# Patient Record
Sex: Female | Born: 1980 | Race: White | Hispanic: No | Marital: Single | Smoking: Former smoker
Health system: Southern US, Community
[De-identification: ages and names within clinical notes are randomized; demographics above are authoritative.]

## PROBLEM LIST (undated history)

## (undated) DIAGNOSIS — F909 Attention-deficit hyperactivity disorder, unspecified type: Secondary | ICD-10-CM

## (undated) DIAGNOSIS — R011 Cardiac murmur, unspecified: Secondary | ICD-10-CM

## (undated) DIAGNOSIS — F419 Anxiety disorder, unspecified: Secondary | ICD-10-CM

## (undated) DIAGNOSIS — D649 Anemia, unspecified: Secondary | ICD-10-CM

## (undated) HISTORY — DX: Anxiety disorder, unspecified: F41.9

## (undated) HISTORY — DX: Cardiac murmur, unspecified: R01.1

## (undated) HISTORY — DX: Attention-deficit hyperactivity disorder, unspecified type: F90.9

## (undated) HISTORY — DX: Anemia, unspecified: D64.9

---

## 2004-08-31 ENCOUNTER — Emergency Department: Payer: Self-pay | Admitting: Emergency Medicine

## 2004-11-16 ENCOUNTER — Emergency Department: Payer: Self-pay | Admitting: Emergency Medicine

## 2004-11-20 ENCOUNTER — Observation Stay: Payer: Self-pay | Admitting: Unknown Physician Specialty

## 2005-07-22 ENCOUNTER — Emergency Department: Payer: Self-pay | Admitting: Unknown Physician Specialty

## 2007-02-08 ENCOUNTER — Emergency Department: Payer: Self-pay | Admitting: Emergency Medicine

## 2019-05-25 HISTORY — PX: LIPOSUCTION: SHX10

## 2020-03-02 LAB — OB RESULTS CONSOLE HEPATITIS B SURFACE ANTIGEN: Hepatitis B Surface Ag: NEGATIVE

## 2020-03-02 LAB — OB RESULTS CONSOLE ANTIBODY SCREEN: Antibody Screen: NEGATIVE

## 2020-03-02 LAB — OB RESULTS CONSOLE HIV ANTIBODY (ROUTINE TESTING): HIV: NONREACTIVE

## 2020-03-02 LAB — OB RESULTS CONSOLE RUBELLA ANTIBODY, IGM: Rubella: IMMUNE

## 2020-03-02 LAB — OB RESULTS CONSOLE ABO/RH: RH Type: POSITIVE

## 2020-03-02 LAB — OB RESULTS CONSOLE RPR: RPR: NONREACTIVE

## 2020-04-08 ENCOUNTER — Other Ambulatory Visit: Payer: Self-pay | Admitting: Obstetrics and Gynecology

## 2020-04-08 DIAGNOSIS — O09522 Supervision of elderly multigravida, second trimester: Secondary | ICD-10-CM

## 2020-05-04 ENCOUNTER — Encounter: Payer: Self-pay | Admitting: *Deleted

## 2020-05-06 ENCOUNTER — Other Ambulatory Visit: Payer: Self-pay

## 2020-05-06 ENCOUNTER — Encounter: Payer: Self-pay | Admitting: *Deleted

## 2020-05-06 ENCOUNTER — Ambulatory Visit: Payer: Medicaid Other | Admitting: *Deleted

## 2020-05-06 ENCOUNTER — Ambulatory Visit: Payer: Medicaid Other | Attending: Obstetrics and Gynecology

## 2020-05-06 ENCOUNTER — Other Ambulatory Visit: Payer: Self-pay | Admitting: *Deleted

## 2020-05-06 VITALS — BP 118/69 | HR 85 | Ht 62.5 in

## 2020-05-06 DIAGNOSIS — O322XX0 Maternal care for transverse and oblique lie, not applicable or unspecified: Secondary | ICD-10-CM

## 2020-05-06 DIAGNOSIS — Z3A19 19 weeks gestation of pregnancy: Secondary | ICD-10-CM

## 2020-05-06 DIAGNOSIS — O09522 Supervision of elderly multigravida, second trimester: Secondary | ICD-10-CM | POA: Insufficient documentation

## 2020-05-06 DIAGNOSIS — D649 Anemia, unspecified: Secondary | ICD-10-CM

## 2020-05-06 DIAGNOSIS — O99012 Anemia complicating pregnancy, second trimester: Secondary | ICD-10-CM

## 2020-05-06 DIAGNOSIS — R011 Cardiac murmur, unspecified: Secondary | ICD-10-CM

## 2020-05-06 DIAGNOSIS — O99412 Diseases of the circulatory system complicating pregnancy, second trimester: Secondary | ICD-10-CM

## 2020-05-06 DIAGNOSIS — Z363 Encounter for antenatal screening for malformations: Secondary | ICD-10-CM

## 2020-06-10 ENCOUNTER — Encounter: Payer: Self-pay | Admitting: *Deleted

## 2020-06-10 ENCOUNTER — Ambulatory Visit: Payer: Medicaid Other | Admitting: *Deleted

## 2020-06-10 ENCOUNTER — Ambulatory Visit: Payer: Medicaid Other | Attending: Obstetrics and Gynecology

## 2020-06-10 ENCOUNTER — Other Ambulatory Visit: Payer: Self-pay | Admitting: *Deleted

## 2020-06-10 ENCOUNTER — Other Ambulatory Visit: Payer: Self-pay

## 2020-06-10 VITALS — BP 114/65 | HR 74

## 2020-06-10 DIAGNOSIS — D649 Anemia, unspecified: Secondary | ICD-10-CM | POA: Diagnosis not present

## 2020-06-10 DIAGNOSIS — O09522 Supervision of elderly multigravida, second trimester: Secondary | ICD-10-CM | POA: Diagnosis not present

## 2020-06-10 DIAGNOSIS — Z3A24 24 weeks gestation of pregnancy: Secondary | ICD-10-CM

## 2020-06-10 DIAGNOSIS — O99012 Anemia complicating pregnancy, second trimester: Secondary | ICD-10-CM

## 2020-06-10 DIAGNOSIS — O99891 Other specified diseases and conditions complicating pregnancy: Secondary | ICD-10-CM

## 2020-06-10 DIAGNOSIS — R011 Cardiac murmur, unspecified: Secondary | ICD-10-CM

## 2020-08-04 ENCOUNTER — Encounter: Payer: Self-pay | Admitting: *Deleted

## 2020-08-04 ENCOUNTER — Ambulatory Visit: Payer: Medicaid Other | Attending: Obstetrics and Gynecology | Admitting: *Deleted

## 2020-08-04 ENCOUNTER — Other Ambulatory Visit: Payer: Self-pay | Admitting: *Deleted

## 2020-08-04 ENCOUNTER — Ambulatory Visit (HOSPITAL_BASED_OUTPATIENT_CLINIC_OR_DEPARTMENT_OTHER): Payer: Medicaid Other

## 2020-08-04 ENCOUNTER — Other Ambulatory Visit: Payer: Self-pay

## 2020-08-04 VITALS — BP 118/61 | HR 87

## 2020-08-04 DIAGNOSIS — Z3A32 32 weeks gestation of pregnancy: Secondary | ICD-10-CM | POA: Diagnosis not present

## 2020-08-04 DIAGNOSIS — O99013 Anemia complicating pregnancy, third trimester: Secondary | ICD-10-CM | POA: Diagnosis not present

## 2020-08-04 DIAGNOSIS — O09523 Supervision of elderly multigravida, third trimester: Secondary | ICD-10-CM | POA: Insufficient documentation

## 2020-08-04 DIAGNOSIS — O09522 Supervision of elderly multigravida, second trimester: Secondary | ICD-10-CM

## 2020-08-05 ENCOUNTER — Ambulatory Visit: Payer: Medicaid Other

## 2020-09-07 LAB — OB RESULTS CONSOLE GBS: GBS: NEGATIVE

## 2020-09-08 ENCOUNTER — Telehealth (HOSPITAL_COMMUNITY): Payer: Self-pay | Admitting: *Deleted

## 2020-09-08 NOTE — Telephone Encounter (Signed)
Preadmission screen  

## 2020-09-09 ENCOUNTER — Ambulatory Visit: Payer: Medicaid Other | Attending: Obstetrics and Gynecology

## 2020-09-09 ENCOUNTER — Ambulatory Visit: Payer: Medicaid Other | Admitting: *Deleted

## 2020-09-09 ENCOUNTER — Encounter: Payer: Self-pay | Admitting: *Deleted

## 2020-09-09 ENCOUNTER — Other Ambulatory Visit: Payer: Self-pay

## 2020-09-09 VITALS — BP 124/67 | HR 84

## 2020-09-09 DIAGNOSIS — O09523 Supervision of elderly multigravida, third trimester: Secondary | ICD-10-CM | POA: Diagnosis not present

## 2020-09-09 DIAGNOSIS — O09522 Supervision of elderly multigravida, second trimester: Secondary | ICD-10-CM | POA: Diagnosis not present

## 2020-09-09 DIAGNOSIS — O09529 Supervision of elderly multigravida, unspecified trimester: Secondary | ICD-10-CM | POA: Insufficient documentation

## 2020-09-14 ENCOUNTER — Other Ambulatory Visit: Payer: Self-pay | Admitting: Obstetrics

## 2020-09-15 ENCOUNTER — Non-Acute Institutional Stay (HOSPITAL_COMMUNITY)
Admission: RE | Admit: 2020-09-15 | Discharge: 2020-09-15 | Disposition: A | Discharge status: Home / Self Care | Payer: Medicaid Other | Source: Ambulatory Visit | Attending: Internal Medicine | Admitting: Internal Medicine

## 2020-09-15 ENCOUNTER — Other Ambulatory Visit: Payer: Self-pay

## 2020-09-15 DIAGNOSIS — Z3A00 Weeks of gestation of pregnancy not specified: Secondary | ICD-10-CM | POA: Diagnosis not present

## 2020-09-15 DIAGNOSIS — D649 Anemia, unspecified: Secondary | ICD-10-CM | POA: Insufficient documentation

## 2020-09-15 DIAGNOSIS — O99019 Anemia complicating pregnancy, unspecified trimester: Secondary | ICD-10-CM | POA: Insufficient documentation

## 2020-09-15 MED ORDER — SODIUM CHLORIDE 0.9 % IV SOLN
510.0000 mg | Freq: Once | INTRAVENOUS | Status: AC
  Administered 2020-09-15: 510 mg via INTRAVENOUS
  Filled 2020-09-15: qty 510

## 2020-09-15 MED ORDER — SODIUM CHLORIDE 0.9 % IV SOLN
INTRAVENOUS | Status: DC | PRN
  Administered 2020-09-15: 250 mL via INTRAVENOUS

## 2020-09-15 NOTE — Discharge Instructions (Signed)
Ferumoxytol injection What is this medicine? FERUMOXYTOL is an iron complex. Iron is used to make healthy red blood cells, which carry oxygen and nutrients throughout the body. This medicine is used to treat iron deficiency anemia. This medicine may be used for other purposes; ask your health care provider or pharmacist if you have questions. COMMON BRAND NAME(S): Feraheme What should I tell my health care provider before I take this medicine? They need to know if you have any of these conditions:  anemia not caused by low iron levels  high levels of iron in the blood  magnetic resonance imaging (MRI) test scheduled  an unusual or allergic reaction to iron, other medicines, foods, dyes, or preservatives  pregnant or trying to get pregnant  breast-feeding How should I use this medicine? This medicine is for injection into a vein. It is given by a health care professional in a hospital or clinic setting. Talk to your pediatrician regarding the use of this medicine in children. Special care may be needed. Overdosage: If you think you have taken too much of this medicine contact a poison control center or emergency room at once. NOTE: This medicine is only for you. Do not share this medicine with others. What if I miss a dose? It is important not to miss your dose. Call your doctor or health care professional if you are unable to keep an appointment. What may interact with this medicine? This medicine may interact with the following medications:  other iron products This list may not describe all possible interactions. Give your health care provider a list of all the medicines, herbs, non-prescription drugs, or dietary supplements you use. Also tell them if you smoke, drink alcohol, or use illegal drugs. Some items may interact with your medicine. What should I watch for while using this medicine? Visit your doctor or healthcare professional regularly. Tell your doctor or healthcare  professional if your symptoms do not start to get better or if they get worse. You may need blood work done while you are taking this medicine. You may need to follow a special diet. Talk to your doctor. Foods that contain iron include: whole grains/cereals, dried fruits, beans, or peas, leafy green vegetables, and organ meats (liver, kidney). What side effects may I notice from receiving this medicine? Side effects that you should report to your doctor or health care professional as soon as possible:  allergic reactions like skin rash, itching or hives, swelling of the face, lips, or tongue  breathing problems  changes in blood pressure  feeling faint or lightheaded, falls  fever or chills  flushing, sweating, or hot feelings  swelling of the ankles or feet Side effects that usually do not require medical attention (report to your doctor or health care professional if they continue or are bothersome):  diarrhea  headache  nausea, vomiting  stomach pain This list may not describe all possible side effects. Call your doctor for medical advice about side effects. You may report side effects to FDA at 1-800-FDA-1088. Where should I keep my medicine? This drug is given in a hospital or clinic and will not be stored at home. NOTE: This sheet is a summary. It may not cover all possible information. If you have questions about this medicine, talk to your doctor, pharmacist, or health care provider.  2021 Elsevier/Gold Standard (2016-05-28 20:21:10)  

## 2020-09-15 NOTE — Progress Notes (Signed)
Patient received IV Feraheme as ordered by Philip Aspen DO. Observed for at least 30 minutes post infusion. Tolerated well, vitals stable, discharge instructions given, verbalized understanding. Patient alert, oriented and ambulatory at the time of discharge.

## 2020-09-16 ENCOUNTER — Ambulatory Visit: Payer: Medicaid Other | Attending: Obstetrics and Gynecology

## 2020-09-16 ENCOUNTER — Encounter: Payer: Self-pay | Admitting: *Deleted

## 2020-09-16 ENCOUNTER — Ambulatory Visit: Payer: Medicaid Other | Admitting: *Deleted

## 2020-09-16 VITALS — BP 125/67 | HR 82

## 2020-09-16 DIAGNOSIS — O09522 Supervision of elderly multigravida, second trimester: Secondary | ICD-10-CM | POA: Insufficient documentation

## 2020-09-16 DIAGNOSIS — O09523 Supervision of elderly multigravida, third trimester: Secondary | ICD-10-CM | POA: Insufficient documentation

## 2020-09-19 ENCOUNTER — Other Ambulatory Visit: Payer: Self-pay

## 2020-09-20 ENCOUNTER — Encounter (HOSPITAL_COMMUNITY): Payer: Self-pay | Admitting: Obstetrics

## 2020-09-20 ENCOUNTER — Inpatient Hospital Stay (HOSPITAL_COMMUNITY): Payer: Medicaid Other | Admitting: Anesthesiology

## 2020-09-20 ENCOUNTER — Inpatient Hospital Stay (HOSPITAL_COMMUNITY): Payer: Medicaid Other

## 2020-09-20 ENCOUNTER — Inpatient Hospital Stay (HOSPITAL_COMMUNITY)
Admission: AD | Admit: 2020-09-20 | Discharge: 2020-09-23 | DRG: 786 | Disposition: A | Discharge status: Home / Self Care | Payer: Medicaid Other | Attending: Obstetrics & Gynecology | Admitting: Obstetrics & Gynecology

## 2020-09-20 ENCOUNTER — Other Ambulatory Visit (HOSPITAL_COMMUNITY)
Admission: AD | Admit: 2020-09-20 | Payer: Medicaid Other | Source: Home / Self Care | Admitting: Obstetrics & Gynecology

## 2020-09-20 ENCOUNTER — Other Ambulatory Visit (HOSPITAL_COMMUNITY): Payer: Medicaid Other

## 2020-09-20 DIAGNOSIS — O9902 Anemia complicating childbirth: Principal | ICD-10-CM | POA: Diagnosis present

## 2020-09-20 DIAGNOSIS — O411230 Chorioamnionitis, third trimester, not applicable or unspecified: Secondary | ICD-10-CM | POA: Diagnosis present

## 2020-09-20 DIAGNOSIS — Z20822 Contact with and (suspected) exposure to covid-19: Secondary | ICD-10-CM | POA: Diagnosis not present

## 2020-09-20 DIAGNOSIS — Z87891 Personal history of nicotine dependence: Secondary | ICD-10-CM | POA: Diagnosis not present

## 2020-09-20 DIAGNOSIS — D509 Iron deficiency anemia, unspecified: Secondary | ICD-10-CM | POA: Diagnosis present

## 2020-09-20 DIAGNOSIS — Z3A39 39 weeks gestation of pregnancy: Secondary | ICD-10-CM | POA: Diagnosis not present

## 2020-09-20 DIAGNOSIS — O09529 Supervision of elderly multigravida, unspecified trimester: Secondary | ICD-10-CM

## 2020-09-20 DIAGNOSIS — O26893 Other specified pregnancy related conditions, third trimester: Secondary | ICD-10-CM | POA: Diagnosis present

## 2020-09-20 LAB — SARS CORONAVIRUS 2 (TAT 6-24 HRS): SARS Coronavirus 2: NEGATIVE

## 2020-09-20 LAB — CBC
HCT: 33.7 % — ABNORMAL LOW (ref 36.0–46.0)
Hemoglobin: 9.5 g/dL — ABNORMAL LOW (ref 12.0–15.0)
MCH: 20.6 pg — ABNORMAL LOW (ref 26.0–34.0)
MCHC: 28.2 g/dL — ABNORMAL LOW (ref 30.0–36.0)
MCV: 72.9 fL — ABNORMAL LOW (ref 80.0–100.0)
Platelets: 341 10*3/uL (ref 150–400)
RBC: 4.62 MIL/uL (ref 3.87–5.11)
RDW: 22.8 % — ABNORMAL HIGH (ref 11.5–15.5)
WBC: 13.3 10*3/uL — ABNORMAL HIGH (ref 4.0–10.5)
nRBC: 0.4 % — ABNORMAL HIGH (ref 0.0–0.2)

## 2020-09-20 LAB — TYPE AND SCREEN
ABO/RH(D): B POS
Antibody Screen: NEGATIVE

## 2020-09-20 LAB — RPR: RPR Ser Ql: NONREACTIVE

## 2020-09-20 MED ORDER — PHENYLEPHRINE 40 MCG/ML (10ML) SYRINGE FOR IV PUSH (FOR BLOOD PRESSURE SUPPORT)
80.0000 ug | PREFILLED_SYRINGE | INTRAVENOUS | Status: DC | PRN
  Filled 2020-09-20: qty 10

## 2020-09-20 MED ORDER — LIDOCAINE HCL (PF) 1 % IJ SOLN
INTRAMUSCULAR | Status: DC | PRN
  Administered 2020-09-20: 10 mL via EPIDURAL

## 2020-09-20 MED ORDER — OXYTOCIN-SODIUM CHLORIDE 30-0.9 UT/500ML-% IV SOLN
2.5000 [IU]/h | INTRAVENOUS | Status: DC
  Filled 2020-09-20: qty 500

## 2020-09-20 MED ORDER — TERBUTALINE SULFATE 1 MG/ML IJ SOLN
0.2500 mg | Freq: Once | INTRAMUSCULAR | Status: DC | PRN
  Filled 2020-09-20: qty 1

## 2020-09-20 MED ORDER — ACETAMINOPHEN 500 MG PO TABS: 1000.0000 mg | ORAL_TABLET | Freq: Once | ORAL | Status: DC

## 2020-09-20 MED ORDER — EPHEDRINE 5 MG/ML INJ: 10.0000 mg | INTRAVENOUS | Status: DC | PRN

## 2020-09-20 MED ORDER — OXYTOCIN-SODIUM CHLORIDE 30-0.9 UT/500ML-% IV SOLN
1.0000 m[IU]/min | INTRAVENOUS | Status: DC
  Administered 2020-09-20: 2 m[IU]/min via INTRAVENOUS
  Filled 2020-09-20: qty 500

## 2020-09-20 MED ORDER — LACTATED RINGERS IV SOLN: INTRAVENOUS | Status: DC

## 2020-09-20 MED ORDER — OXYTOCIN BOLUS FROM INFUSION: 333.0000 mL | Freq: Once | INTRAVENOUS | Status: DC

## 2020-09-20 MED ORDER — GENTAMICIN SULFATE 40 MG/ML IJ SOLN
5.0000 mg/kg | Freq: Once | INTRAVENOUS | Status: AC
  Administered 2020-09-20: 320 mg via INTRAVENOUS
  Filled 2020-09-20: qty 8

## 2020-09-20 MED ORDER — ONDANSETRON HCL 4 MG/2ML IJ SOLN: 4.0000 mg | Freq: Four times a day (QID) | INTRAMUSCULAR | Status: DC | PRN

## 2020-09-20 MED ORDER — ACETAMINOPHEN 160 MG/5ML PO SOLN
1000.0000 mg | Freq: Once | ORAL | Status: AC
  Administered 2020-09-20: 1000 mg via ORAL
  Filled 2020-09-20: qty 40.6

## 2020-09-20 MED ORDER — LACTATED RINGERS AMNIOINFUSION
INTRAVENOUS | Status: DC
Start: 2020-09-20 — End: 2020-09-21

## 2020-09-20 MED ORDER — OXYCODONE-ACETAMINOPHEN 5-325 MG PO TABS: 1.0000 | ORAL_TABLET | ORAL | Status: DC | PRN

## 2020-09-20 MED ORDER — DIPHENHYDRAMINE HCL 50 MG/ML IJ SOLN: 12.5000 mg | INTRAMUSCULAR | Status: DC | PRN

## 2020-09-20 MED ORDER — SODIUM CHLORIDE 0.9 % IV SOLN
2.0000 g | Freq: Four times a day (QID) | INTRAVENOUS | Status: DC
  Administered 2020-09-20 – 2020-09-21 (×2): 2 g via INTRAVENOUS
  Filled 2020-09-20: qty 2000

## 2020-09-20 MED ORDER — SOD CITRATE-CITRIC ACID 500-334 MG/5ML PO SOLN
30.0000 mL | ORAL | Status: DC | PRN
  Filled 2020-09-20: qty 15

## 2020-09-20 MED ORDER — ACETAMINOPHEN 325 MG PO TABS: 650.0000 mg | ORAL_TABLET | ORAL | Status: DC | PRN

## 2020-09-20 MED ORDER — MISOPROSTOL 25 MCG QUARTER TABLET
25.0000 ug | ORAL_TABLET | ORAL | Status: DC | PRN
  Administered 2020-09-20: 25 ug via VAGINAL
  Filled 2020-09-20: qty 1

## 2020-09-20 MED ORDER — OXYCODONE-ACETAMINOPHEN 5-325 MG PO TABS: 2.0000 | ORAL_TABLET | ORAL | Status: DC | PRN

## 2020-09-20 MED ORDER — FENTANYL-BUPIVACAINE-NACL 0.5-0.125-0.9 MG/250ML-% EP SOLN
12.0000 mL/h | EPIDURAL | Status: DC | PRN
  Administered 2020-09-20: 12 mL/h via EPIDURAL
  Filled 2020-09-20: qty 250

## 2020-09-20 MED ORDER — LIDOCAINE HCL (PF) 1 % IJ SOLN: 30.0000 mL | INTRAMUSCULAR | Status: DC | PRN

## 2020-09-20 MED ORDER — FENTANYL CITRATE (PF) 100 MCG/2ML IJ SOLN
50.0000 ug | INTRAMUSCULAR | Status: DC | PRN
  Administered 2020-09-20: 50 ug via INTRAVENOUS
  Filled 2020-09-20: qty 2

## 2020-09-20 MED ORDER — LACTATED RINGERS AMNIOINFUSION: INTRAVENOUS | Status: DC

## 2020-09-20 MED ORDER — TERBUTALINE SULFATE 1 MG/ML IJ SOLN
0.2500 mg | Freq: Once | INTRAMUSCULAR | Status: AC | PRN
  Administered 2020-09-21: 0.25 mg via SUBCUTANEOUS

## 2020-09-20 MED ORDER — LACTATED RINGERS IV SOLN: 500.0000 mL | Freq: Once | INTRAVENOUS | Status: DC

## 2020-09-20 MED ORDER — LACTATED RINGERS IV SOLN
500.0000 mL | INTRAVENOUS | Status: DC | PRN
  Administered 2020-09-20: 500 mL via INTRAVENOUS

## 2020-09-20 MED ORDER — PHENYLEPHRINE 40 MCG/ML (10ML) SYRINGE FOR IV PUSH (FOR BLOOD PRESSURE SUPPORT): 80.0000 ug | PREFILLED_SYRINGE | INTRAVENOUS | Status: DC | PRN

## 2020-09-20 NOTE — Anesthesia Procedure Notes (Signed)
Epidural Patient location during procedure: OB Start time: 09/20/2020 11:45 AM End time: 09/20/2020 12:00 PM  Staffing Anesthesiologist: Lucretia Kern, MD Performed: anesthesiologist   Preanesthetic Checklist Completed: patient identified, IV checked, risks and benefits discussed, monitors and equipment checked, pre-op evaluation and timeout performed  Epidural Patient position: sitting Prep: DuraPrep Patient monitoring: heart rate, continuous pulse ox and blood pressure Approach: midline Location: L3-L4 Injection technique: LOR air  Needle:  Needle type: Tuohy  Needle gauge: 18 G Needle length: 9 cm Needle insertion depth: 7 cm Catheter type: closed end flexible Catheter size: 20 Guage Catheter at skin depth: 12 cm Test dose: negative  Assessment Events: blood not aspirated, injection not painful, no injection resistance, no paresthesia and negative IV test  Additional Notes Reason for block:procedure for pain

## 2020-09-20 NOTE — Anesthesia Preprocedure Evaluation (Addendum)
Anesthesia Evaluation  Patient identified by MRN, date of birth, ID band Patient awake    Reviewed: Allergy & Precautions, H&P , NPO status , Patient's Chart, lab work & pertinent test results  History of Anesthesia Complications Negative for: history of anesthetic complications  Airway Mallampati: II  TM Distance: >3 FB Neck ROM: full    Dental no notable dental hx.    Pulmonary neg pulmonary ROS, former smoker,    Pulmonary exam normal        Cardiovascular negative cardio ROS Normal cardiovascular exam Rhythm:regular Rate:Normal     Neuro/Psych negative neurological ROS  negative psych ROS   GI/Hepatic negative GI ROS, Neg liver ROS,   Endo/Other  negative endocrine ROS  Renal/GU negative Renal ROS  negative genitourinary   Musculoskeletal   Abdominal   Peds  Hematology  (+) Blood dyscrasia, anemia ,   Anesthesia Other Findings   Reproductive/Obstetrics (+) Pregnancy Fetal intolerance to labor                            Anesthesia Physical Anesthesia Plan  ASA: II and emergent  Anesthesia Plan: Epidural   Post-op Pain Management:    Induction:   PONV Risk Score and Plan: 4 or greater and Scopolamine patch - Pre-op and Treatment may vary due to age or medical condition  Airway Management Planned: Natural Airway  Additional Equipment:   Intra-op Plan:   Post-operative Plan:   Informed Consent: I have reviewed the patients History and Physical, chart, labs and discussed the procedure including the risks, benefits and alternatives for the proposed anesthesia with the patient or authorized representative who has indicated his/her understanding and acceptance.     Dental advisory given  Plan Discussed with: Anesthesiologist and CRNA  Anesthesia Plan Comments: (Patient for urgent C/Section for fetal intolerance to labor. Will use Epidural for C/Section. M. Malen Gauze, MD)       Anesthesia Quick Evaluation

## 2020-09-20 NOTE — H&P (Signed)
Kimberly Mendez is a 40 y.o. female [phone] [redacted]w[redacted]d presenting for scheduled IOL due to [facility]. She reports on admission no LOF, VB, Contractions. Normal FM. Now contracting painfully.    Pregnancy c/b: 1. Anemia: Hgb 8.5 at 26w, received IV iron 2. Recurrent UTI 3. Advanced maternal age: Most recent EFW 2024g 99%, AC>99% on 09/09/2020  OB History    Gravida  5   Para  1   Term  1   Preterm      AB  3   Living  1     SAB  3   IAB      Ectopic      Multiple      Live Births             Past Medical History:  Diagnosis Date  . ADHD   . Anemia   . Anxiety   . Heart murmur    Past Surgical History:  Procedure Laterality Date  . LIPOSUCTION  05/25/2019   abd   Family History: family history includes Cancer in her maternal grandmother; Depression in her sister; Drug abuse in her sister; Miscarriages / Stillbirths in her sister; Varicose Veins in her paternal grandmother. Social History:  reports that she has quit smoking. She has quit using smokeless tobacco. She reports previous alcohol use. She reports previous drug use. Drug: Marijuana.    Maternal Diabetes: No Genetic Screening: Normal  [id number] Negative Maternal Ultrasounds/Referrals: Normal anatomy. Placenta Anterior Fetal Ultrasounds or other Referrals:  Referred to Materal Fetal Medicine for anatomy scan Maternal Substance Abuse:  No Significant Maternal Medications:  Meds include: Other: fosfomycin prn for UTI. Flexeril prn Significant Maternal Lab Results:  Group B Strep negative Other Comments:  AMA, anemia, UTI in pregnancy  Review of Systems Per HPI Exam Physical Exam  Dilation: Fingertip Effacement (%): Thick Station: -2 Exam by:: MScott, RN Blood pressure (!) 146/75, pulse 83, temperature 98.5 F (36.9 C), temperature source Oral, resp. rate 16, height 5' 2.5" (1.588 m), weight 83.5 kg, last menstrual period 12/21/2019. NAD, resting comfortably, painful with contractions Gravid abdomen Card:  RRR Pulm: CTAB Lower extremity no evidence of DVT Fetal testing: FHR 130, Cat 1. Toco q33m Prenatal labs: ABO, Rh:  --/--/B POS (05/31 0030) Antibody: NEG (05/31 0030) Rubella: Immune (11/10 0000) RPR: Nonreactive (11/10 0000)  HBsAg: Negative (11/10 0000)  HIV: Non-reactive (11/10 0000)  GBS: Negative/-- (05/18 0000)   Hematology Recent Labs  Lab 09/20/20 0058  WBC 13.3*  RBC 4.62  HGB 9.5*  HCT 33.7*  MCV 72.9*  MCH 20.6*  MCHC 28.2*  RDW 22.8*  PLT 341   Assessment/Plan: [name] is a 40 y.o. female [id number] [redacted]w[redacted]d admitted for scheduled IOL.  1. IOL: On admit 0.5/thick, received cytotec x1, was then unchanged but contracting q1h. Now 2/50/-3, FB placed. If contractions space, start pitocin.  LGA: shoulder dystocia precautions. No contraindications for uterotonics.  GBS neg 2. Anemia: Hgb 8.5 at 26w, received IV iron. Now hgb 9.5 3. Vaccines: s/p tdap in pregnancy. Did not receive flu or COVID  Kaylei Frink K Taam-Akelman 09/20/2020, 8:58 AM

## 2020-09-20 NOTE — Progress Notes (Signed)
OBGYN Note Kimberly Mendez comfortable with epidural Vitals:   09/20/20 1331 09/20/20 1401  BP: 130/76 126/75  Pulse: 100 88  Resp:    Temp:     FHR 130, Cat 1 Toco q37m SVE: cervix high and concern for palpating fetal part on exam. Bedside US by me confirmed VTX presentation. Defer AROM at this time.  Kimberly Mendez 40 y.o. [id number] at [redacted]w[redacted]d IOL 2/2 AMA -Continue pitocin Kimberly Mendez Kimberly Mendez 09/20/20 2:38 PM

## 2020-09-20 NOTE — Progress Notes (Signed)
Vitals:   09/20/20 2153 09/20/20 2201 09/20/20 2231 09/20/20 2253  BP:  117/71 (!) 112/52   Pulse:  (!) 116 (!) 126   Resp:      Temp: (!) 101 F (38.3 C)   (!) 100.8 F (38.2 C)  TempSrc: Axillary   Oral  Weight:      Height:       FHR 170, previously minimal variability, now moderate variability, occasional variable decels Toco irritable  Kimberly Mendez 40 y.o. [id number] at [redacted]w[redacted]d IOL for AMA 1. Presumed intraamniotic infection: new maternal fever (101) and fetal tachycardia 180s, treating as intraamniotic infection with tylenol 1g, amp 2g IV q6h and gentamycin 5mg /kg q24h 2. Fetal monitoring: previous recurrent variable decels resolved with holding pitocin and amnioinfusion, initially held amnioinfusion and restarted pitocin, due to variable decels reoccurring, restarted amnioinfusion. Patient requested to discuss CS earlier, overall fetal status reassuring, reviewed indications for CS 3. IOL: 0.5cm on admit, s/p cytotec, FB, pitocin, SROM. Continue pitocin, will continue to review plan with patient pending progression and fetal monitoring.   Kimberly Mendez 09/20/20 10:58 PM

## 2020-09-20 NOTE — Progress Notes (Signed)
OBGYN Note Vitals:   09/20/20 1801 09/20/20 1832 09/20/20 1851 09/20/20 1902  BP: 138/78 128/79  112/62  Pulse: 88 90  (!) 121  Resp:   16   Temp:   98.8 F (37.1 C)   TempSrc:   Oral   Weight:      Height:       SVE 5/80/0, IUPC placed FHR 150, Moderate variability, recurrent variable decels Toco q12m  Kimberly Mendez 40 y.o. [id number] IOL for AMA 1. IOL: s/p cytotec, FB. On pitocin. S/p SROM. IUPC placed for amnioinfusion, hold pitocin.  Continue close monitoring Kimberly Mendez 09/20/20 7:40 PM

## 2020-09-21 ENCOUNTER — Encounter (HOSPITAL_COMMUNITY)
Admission: AD | Disposition: A | Discharge status: Home / Self Care | Payer: Self-pay | Source: Home / Self Care | Attending: Obstetrics & Gynecology

## 2020-09-21 ENCOUNTER — Encounter (HOSPITAL_COMMUNITY): Payer: Self-pay | Admitting: Obstetrics

## 2020-09-21 LAB — CBC
HCT: 27 % — ABNORMAL LOW (ref 36.0–46.0)
Hemoglobin: 7.8 g/dL — ABNORMAL LOW (ref 12.0–15.0)
MCH: 21.3 pg — ABNORMAL LOW (ref 26.0–34.0)
MCHC: 28.9 g/dL — ABNORMAL LOW (ref 30.0–36.0)
MCV: 73.6 fL — ABNORMAL LOW (ref 80.0–100.0)
Platelets: 236 10*3/uL (ref 150–400)
RBC: 3.67 MIL/uL — ABNORMAL LOW (ref 3.87–5.11)
RDW: 22.9 % — ABNORMAL HIGH (ref 11.5–15.5)
WBC: 30.7 10*3/uL — ABNORMAL HIGH (ref 4.0–10.5)
nRBC: 0.3 % — ABNORMAL HIGH (ref 0.0–0.2)

## 2020-09-21 SURGERY — Surgical Case
Anesthesia: Epidural

## 2020-09-21 MED ORDER — PHENYLEPHRINE 40 MCG/ML (10ML) SYRINGE FOR IV PUSH (FOR BLOOD PRESSURE SUPPORT)
PREFILLED_SYRINGE | INTRAVENOUS | Status: AC
  Filled 2020-09-21: qty 10

## 2020-09-21 MED ORDER — NALOXONE HCL 4 MG/10ML IJ SOLN
1.0000 ug/kg/h | INTRAVENOUS | Status: DC | PRN
  Filled 2020-09-21: qty 5

## 2020-09-21 MED ORDER — BISACODYL 10 MG RE SUPP: 10.0000 mg | Freq: Every day | RECTAL | Status: DC | PRN

## 2020-09-21 MED ORDER — OXYCODONE HCL 5 MG PO TABS
5.0000 mg | ORAL_TABLET | ORAL | Status: DC | PRN
  Administered 2020-09-22: 5 mg via ORAL
  Administered 2020-09-22: 10 mg via ORAL
  Administered 2020-09-22 – 2020-09-23 (×2): 5 mg via ORAL
  Filled 2020-09-21 (×3): qty 1
  Filled 2020-09-21: qty 2
  Filled 2020-09-21: qty 1

## 2020-09-21 MED ORDER — LIDOCAINE-EPINEPHRINE (PF) 2 %-1:200000 IJ SOLN
INTRAMUSCULAR | Status: DC | PRN
  Administered 2020-09-21: 5 mL via EPIDURAL
  Administered 2020-09-21: 3 mL via EPIDURAL
  Administered 2020-09-21: 10 mL via EPIDURAL

## 2020-09-21 MED ORDER — GENTAMICIN SULFATE 40 MG/ML IJ SOLN
5.0000 mg/kg | INTRAVENOUS | Status: AC
  Administered 2020-09-21: 320 mg via INTRAVENOUS
  Filled 2020-09-21: qty 8

## 2020-09-21 MED ORDER — ONDANSETRON HCL 4 MG/2ML IJ SOLN
INTRAMUSCULAR | Status: AC
  Filled 2020-09-21: qty 2

## 2020-09-21 MED ORDER — LACTATED RINGERS IV SOLN: INTRAVENOUS | Status: DC

## 2020-09-21 MED ORDER — DIBUCAINE (PERIANAL) 1 % EX OINT: 1.0000 "application " | TOPICAL_OINTMENT | CUTANEOUS | Status: DC | PRN

## 2020-09-21 MED ORDER — DEXAMETHASONE SODIUM PHOSPHATE 4 MG/ML IJ SOLN
INTRAMUSCULAR | Status: AC
  Filled 2020-09-21: qty 1

## 2020-09-21 MED ORDER — SODIUM CHLORIDE 0.9 % IV SOLN
INTRAVENOUS | Status: AC
  Filled 2020-09-21: qty 500

## 2020-09-21 MED ORDER — ACETAMINOPHEN 500 MG PO TABS: 1000.0000 mg | ORAL_TABLET | Freq: Four times a day (QID) | ORAL | Status: DC

## 2020-09-21 MED ORDER — DEXMEDETOMIDINE (PRECEDEX) IN NS 20 MCG/5ML (4 MCG/ML) IV SYRINGE
PREFILLED_SYRINGE | INTRAVENOUS | Status: AC
  Filled 2020-09-21: qty 5

## 2020-09-21 MED ORDER — MORPHINE SULFATE (PF) 0.5 MG/ML IJ SOLN
INTRAMUSCULAR | Status: DC | PRN
  Administered 2020-09-21: 3 mg via EPIDURAL

## 2020-09-21 MED ORDER — DEXAMETHASONE SODIUM PHOSPHATE 4 MG/ML IJ SOLN
INTRAMUSCULAR | Status: DC | PRN
  Administered 2020-09-21: 4 mg via INTRAVENOUS

## 2020-09-21 MED ORDER — ACETAMINOPHEN 160 MG/5ML PO SOLN
1000.0000 mg | Freq: Four times a day (QID) | ORAL | Status: DC
  Administered 2020-09-21 – 2020-09-23 (×9): 1000 mg via ORAL
  Filled 2020-09-21 (×9): qty 40.6

## 2020-09-21 MED ORDER — OXYTOCIN-SODIUM CHLORIDE 30-0.9 UT/500ML-% IV SOLN
2.5000 [IU]/h | INTRAVENOUS | Status: AC
  Administered 2020-09-21: 2.5 [IU]/h via INTRAVENOUS

## 2020-09-21 MED ORDER — LIDOCAINE-EPINEPHRINE (PF) 2 %-1:200000 IJ SOLN
INTRAMUSCULAR | Status: DC | PRN
  Administered 2020-09-21: 5 mL via INTRADERMAL

## 2020-09-21 MED ORDER — IBUPROFEN 600 MG PO TABS: 600.0000 mg | ORAL_TABLET | Freq: Four times a day (QID) | ORAL | Status: DC

## 2020-09-21 MED ORDER — FENTANYL CITRATE (PF) 100 MCG/2ML IJ SOLN
INTRAMUSCULAR | Status: DC | PRN
  Administered 2020-09-21: 100 ug via EPIDURAL

## 2020-09-21 MED ORDER — ONDANSETRON HCL 4 MG/2ML IJ SOLN
INTRAMUSCULAR | Status: DC | PRN
  Administered 2020-09-21: 4 mg via INTRAVENOUS

## 2020-09-21 MED ORDER — NALBUPHINE HCL 10 MG/ML IJ SOLN: 5.0000 mg | INTRAMUSCULAR | Status: DC | PRN

## 2020-09-21 MED ORDER — ZOLPIDEM TARTRATE 5 MG PO TABS: 5.0000 mg | ORAL_TABLET | Freq: Every evening | ORAL | Status: DC | PRN

## 2020-09-21 MED ORDER — FENTANYL CITRATE (PF) 100 MCG/2ML IJ SOLN: 25.0000 ug | INTRAMUSCULAR | Status: DC | PRN

## 2020-09-21 MED ORDER — COCONUT OIL OIL: 1.0000 "application " | TOPICAL_OIL | Status: DC | PRN

## 2020-09-21 MED ORDER — SODIUM CHLORIDE 0.9 % IV SOLN: INTRAVENOUS | Status: DC | PRN

## 2020-09-21 MED ORDER — ONDANSETRON HCL 4 MG/2ML IJ SOLN: 4.0000 mg | Freq: Three times a day (TID) | INTRAMUSCULAR | Status: DC | PRN

## 2020-09-21 MED ORDER — HYDROMORPHONE HCL 1 MG/ML IJ SOLN: 0.2000 mg | INTRAMUSCULAR | Status: DC | PRN

## 2020-09-21 MED ORDER — FENTANYL CITRATE (PF) 100 MCG/2ML IJ SOLN: INTRAMUSCULAR | Status: DC | PRN

## 2020-09-21 MED ORDER — DEXMEDETOMIDINE (PRECEDEX) IN NS 20 MCG/5ML (4 MCG/ML) IV SYRINGE
PREFILLED_SYRINGE | INTRAVENOUS | Status: DC | PRN
  Administered 2020-09-21: 8 ug via INTRAVENOUS

## 2020-09-21 MED ORDER — COMPLETENATE 29-1 MG PO CHEW
1.0000 | CHEWABLE_TABLET | Freq: Every day | ORAL | Status: DC
  Administered 2020-09-21 – 2020-09-22 (×2): 1 via ORAL
  Filled 2020-09-21 (×2): qty 1

## 2020-09-21 MED ORDER — SODIUM CHLORIDE 0.9 % IR SOLN
Status: DC | PRN
  Administered 2020-09-21: 1

## 2020-09-21 MED ORDER — GENTAMICIN SULFATE 40 MG/ML IJ SOLN: 5.0000 mg/kg | INTRAVENOUS | Status: DC

## 2020-09-21 MED ORDER — SOD CITRATE-CITRIC ACID 500-334 MG/5ML PO SOLN: 30.0000 mL | Freq: Once | ORAL | Status: DC

## 2020-09-21 MED ORDER — SODIUM CHLORIDE 0.9% FLUSH: 3.0000 mL | INTRAVENOUS | Status: DC | PRN

## 2020-09-21 MED ORDER — FLEET ENEMA 7-19 GM/118ML RE ENEM: 1.0000 | ENEMA | Freq: Every day | RECTAL | Status: DC | PRN

## 2020-09-21 MED ORDER — CLINDAMYCIN PHOSPHATE 900 MG/50ML IV SOLN
900.0000 mg | Freq: Three times a day (TID) | INTRAVENOUS | Status: DC
  Filled 2020-09-21 (×2): qty 50

## 2020-09-21 MED ORDER — DIPHENHYDRAMINE HCL 25 MG PO CAPS
25.0000 mg | ORAL_CAPSULE | ORAL | Status: DC | PRN
Start: 2020-09-21 — End: 2020-09-23

## 2020-09-21 MED ORDER — SODIUM CHLORIDE 0.9 % IV SOLN
500.0000 mg | INTRAVENOUS | Status: AC
  Administered 2020-09-21: 500 mg via INTRAVENOUS

## 2020-09-21 MED ORDER — SOD CITRATE-CITRIC ACID 500-334 MG/5ML PO SOLN: 30.0000 mL | ORAL | Status: DC

## 2020-09-21 MED ORDER — CLINDAMYCIN PHOSPHATE 900 MG/50ML IV SOLN
INTRAVENOUS | Status: AC
  Filled 2020-09-21: qty 50

## 2020-09-21 MED ORDER — SENNOSIDES-DOCUSATE SODIUM 8.6-50 MG PO TABS
2.0000 | ORAL_TABLET | ORAL | Status: DC
  Administered 2020-09-22: 2 via ORAL
  Filled 2020-09-21: qty 2

## 2020-09-21 MED ORDER — KETOROLAC TROMETHAMINE 30 MG/ML IJ SOLN
30.0000 mg | Freq: Four times a day (QID) | INTRAMUSCULAR | Status: AC
  Administered 2020-09-21 (×4): 30 mg via INTRAVENOUS
  Filled 2020-09-21 (×4): qty 1

## 2020-09-21 MED ORDER — PRENATAL MULTIVITAMIN CH: 1.0000 | ORAL_TABLET | Freq: Every day | ORAL | Status: DC

## 2020-09-21 MED ORDER — PHENYLEPHRINE HCL (PRESSORS) 10 MG/ML IV SOLN
INTRAVENOUS | Status: DC | PRN
  Administered 2020-09-20 – 2020-09-21 (×5): 80 ug via INTRAVENOUS

## 2020-09-21 MED ORDER — SIMETHICONE 80 MG PO CHEW
80.0000 mg | CHEWABLE_TABLET | Freq: Three times a day (TID) | ORAL | Status: DC
  Administered 2020-09-22 – 2020-09-23 (×3): 80 mg via ORAL
  Filled 2020-09-21 (×5): qty 1

## 2020-09-21 MED ORDER — CLINDAMYCIN PHOSPHATE 900 MG/50ML IV SOLN
900.0000 mg | Freq: Three times a day (TID) | INTRAVENOUS | Status: AC
  Administered 2020-09-21 (×2): 900 mg via INTRAVENOUS
  Filled 2020-09-21 (×2): qty 50

## 2020-09-21 MED ORDER — MENTHOL 3 MG MT LOZG: 1.0000 | LOZENGE | OROMUCOSAL | Status: DC | PRN

## 2020-09-21 MED ORDER — CLINDAMYCIN PHOSPHATE 900 MG/50ML IV SOLN
INTRAVENOUS | Status: DC | PRN
  Administered 2020-09-21: 900 mg via INTRAVENOUS

## 2020-09-21 MED ORDER — WITCH HAZEL-GLYCERIN EX PADS: 1.0000 "application " | MEDICATED_PAD | CUTANEOUS | Status: DC | PRN

## 2020-09-21 MED ORDER — OXYTOCIN-SODIUM CHLORIDE 30-0.9 UT/500ML-% IV SOLN
INTRAVENOUS | Status: AC
  Filled 2020-09-21: qty 500

## 2020-09-21 MED ORDER — MORPHINE SULFATE (PF) 0.5 MG/ML IJ SOLN
INTRAMUSCULAR | Status: AC
  Filled 2020-09-21: qty 10

## 2020-09-21 MED ORDER — NALOXONE HCL 0.4 MG/ML IJ SOLN: 0.4000 mg | INTRAMUSCULAR | Status: DC | PRN

## 2020-09-21 MED ORDER — CLINDAMYCIN PHOSPHATE 900 MG/50ML IV SOLN
900.0000 mg | Freq: Three times a day (TID) | INTRAVENOUS | Status: AC
  Administered 2020-09-21: 900 mg via INTRAVENOUS
  Filled 2020-09-21: qty 50

## 2020-09-21 MED ORDER — DIPHENHYDRAMINE HCL 25 MG PO CAPS: 25.0000 mg | ORAL_CAPSULE | Freq: Four times a day (QID) | ORAL | Status: DC | PRN

## 2020-09-21 MED ORDER — NALBUPHINE HCL 10 MG/ML IJ SOLN: 5.0000 mg | Freq: Once | INTRAMUSCULAR | Status: DC | PRN

## 2020-09-21 MED ORDER — SODIUM CHLORIDE 0.9 % IV SOLN
2.0000 g | Freq: Four times a day (QID) | INTRAVENOUS | Status: AC
  Filled 2020-09-21: qty 2000

## 2020-09-21 MED ORDER — OXYTOCIN-SODIUM CHLORIDE 30-0.9 UT/500ML-% IV SOLN
INTRAVENOUS | Status: DC | PRN
  Administered 2020-09-21: 30 [IU] via INTRAVENOUS

## 2020-09-21 MED ORDER — DIPHENHYDRAMINE HCL 50 MG/ML IJ SOLN: 12.5000 mg | INTRAMUSCULAR | Status: DC | PRN

## 2020-09-21 MED ORDER — MEPERIDINE HCL 25 MG/ML IJ SOLN: 6.2500 mg | INTRAMUSCULAR | Status: DC | PRN

## 2020-09-21 MED ORDER — STERILE WATER FOR IRRIGATION IR SOLN
Status: DC | PRN
  Administered 2020-09-21: 1

## 2020-09-21 MED ORDER — FENTANYL CITRATE (PF) 100 MCG/2ML IJ SOLN
INTRAMUSCULAR | Status: AC
  Filled 2020-09-21: qty 2

## 2020-09-21 MED ORDER — LACTATED RINGERS IV SOLN: INTRAVENOUS | Status: DC | PRN

## 2020-09-21 MED ORDER — SIMETHICONE 80 MG PO CHEW: 80.0000 mg | CHEWABLE_TABLET | ORAL | Status: DC | PRN

## 2020-09-21 SURGICAL SUPPLY — 35 items
BENZOIN TINCTURE PRP APPL 2/3 (GAUZE/BANDAGES/DRESSINGS) ×2 IMPLANT
CHLORAPREP W/TINT 26ML (MISCELLANEOUS) ×2 IMPLANT
CLAMP CORD UMBIL (MISCELLANEOUS) IMPLANT
CLOSURE STERI STRIP 1/2 X4 (GAUZE/BANDAGES/DRESSINGS) ×2 IMPLANT
CLOTH BEACON ORANGE TIMEOUT ST (SAFETY) ×2 IMPLANT
DRSG OPSITE POSTOP 4X10 (GAUZE/BANDAGES/DRESSINGS) ×4 IMPLANT
ELECT REM PT RETURN 9FT ADLT (ELECTROSURGICAL) ×2
ELECTRODE REM PT RTRN 9FT ADLT (ELECTROSURGICAL) ×1 IMPLANT
EXTRACTOR VACUUM KIWI (MISCELLANEOUS) IMPLANT
GLOVE BIO SURGEON STRL SZ 6 (GLOVE) ×2 IMPLANT
GLOVE BIOGEL PI IND STRL 6 (GLOVE) ×1 IMPLANT
GLOVE BIOGEL PI IND STRL 7.0 (GLOVE) ×1 IMPLANT
GLOVE BIOGEL PI INDICATOR 6 (GLOVE) ×1
GLOVE BIOGEL PI INDICATOR 7.0 (GLOVE) ×1
GOWN STRL REUS W/TWL LRG LVL3 (GOWN DISPOSABLE) ×4 IMPLANT
KIT ABG SYR 3ML LUER SLIP (SYRINGE) IMPLANT
NEEDLE HYPO 25X5/8 SAFETYGLIDE (NEEDLE) ×2 IMPLANT
NS IRRIG 1000ML POUR BTL (IV SOLUTION) ×2 IMPLANT
PACK C SECTION WH (CUSTOM PROCEDURE TRAY) ×2 IMPLANT
PAD OB MATERNITY 4.3X12.25 (PERSONAL CARE ITEMS) ×2 IMPLANT
PENCIL SMOKE EVAC W/HOLSTER (ELECTROSURGICAL) ×2 IMPLANT
RTRCTR C-SECT PINK 25CM LRG (MISCELLANEOUS) ×2 IMPLANT
STRIP CLOSURE SKIN 1/2X4 (GAUZE/BANDAGES/DRESSINGS) ×2 IMPLANT
SUT MNCRL 0 VIOLET CTX 36 (SUTURE) ×2 IMPLANT
SUT MNCRL AB 4-0 PS2 18 (SUTURE) ×2 IMPLANT
SUT MONOCRYL 0 CTX 36 (SUTURE) ×2
SUT PLAIN 0 NONE (SUTURE) IMPLANT
SUT VIC AB 0 CTX 36 (SUTURE) ×2
SUT VIC AB 0 CTX36XBRD ANBCTRL (SUTURE) ×2 IMPLANT
SUT VIC AB 2-0 CT1 27 (SUTURE) ×2
SUT VIC AB 2-0 CT1 TAPERPNT 27 (SUTURE) ×2 IMPLANT
SYR KIT LINE DRAW 1CC W/FILTR (LINER) ×2 IMPLANT
TOWEL OR 17X24 6PK STRL BLUE (TOWEL DISPOSABLE) ×2 IMPLANT
TRAY FOLEY W/BAG SLVR 14FR LF (SET/KITS/TRAYS/PACK) ×2 IMPLANT
WATER STERILE IRR 1000ML POUR (IV SOLUTION) ×2 IMPLANT

## 2020-09-21 NOTE — Anesthesia Postprocedure Evaluation (Signed)
Anesthesia Post Note  Patient: Kimberly Mendez  Procedure(s) Performed: CESAREAN SECTION (N/A )     Patient location during evaluation: PACU Anesthesia Type: Epidural Level of consciousness: oriented and awake and alert Pain management: pain level controlled Vital Signs Assessment: post-procedure vital signs reviewed and stable Respiratory status: spontaneous breathing, respiratory function stable and nonlabored ventilation Cardiovascular status: blood pressure returned to baseline and stable Postop Assessment: no headache, no backache, no apparent nausea or vomiting, epidural receding and patient able to bend at knees Anesthetic complications: no   No complications documented.                  Duan Scharnhorst A.

## 2020-09-21 NOTE — Progress Notes (Signed)
CRITICAL RESULT PROVIDER NOTIFICATION  Test performed and critical result:  Cord gas PH: 7.12 CO2: 50.9  Date and time result received:   09/21/20 at 0103  Provider name/title:  Dr. Claudine Mouton, MD  Date and time provider notified:  09/21/20 0105  Provider response:No new orders  Provider aware

## 2020-09-21 NOTE — Transfer of Care (Signed)
Immediate Anesthesia Transfer of Care Note  Patient: Kimberly Mendez  Procedure(s) Performed: CESAREAN SECTION (N/A )  Patient Location: PACU  Anesthesia Type:Regional  Level of Consciousness: awake, alert  and oriented  Airway & Oxygen Therapy: Patient Spontanous Breathing  Post-op Assessment: Report given to RN and Post -op Vital signs reviewed and stable  Post vital signs: Reviewed and stable  Last Vitals:  Vitals Value Taken Time  BP 102/54 09/21/20 0145  Temp 37.4 C 09/21/20 0140  Pulse 129 09/21/20 0147  Resp 17 09/21/20 0147  SpO2 100 % 09/21/20 0147  Vitals shown include unvalidated device data.  Last Pain:  Vitals:   09/21/20 0145  TempSrc:   PainSc: 0-No pain         Complications: No complications documented.

## 2020-09-21 NOTE — Social Work (Signed)
CSW received consult for hx of Anxiety and ADHD.  CSW met with MOB to offer support and complete assessment.     CSW observed infant '[name]' sleeping in bassinet. CSW introduced self and role. CSW informed MOB of reason for consult. MOB was welcoming, however had a flat affect. CSW informed MOB of reason for consult. MOB was understanding and reported she has a diagnosis of anxiety and ADHD. MOB reported that she experienced some anxiety during pregnancy, however she has been attending therapy with Dr. Hutchins of [facility] since 2016 which has been helpful. MOB disclosed that prior to pregnancy she was taking Adderall, Prozac, and Xanax as needed. MOB has access to the medications if necessary, which were prescribed by her therapist. CSW assessed MOB current emotions. MOB shared she is doing okay now. MOB identified FOB and her mother as primary supports. MOB denies any current SI, HI or being involved in DV.   CSW provided education regarding the baby blues period versus PPD. CSW provided the New Mom Checklist and encouraged MOB to self evaluate and contact a medical professional if symptoms are noted at any time.  CSW provided review of Sudden Infant Death Syndrome (SIDS) precautions. MOB reported she has essentials for infant, including a bassinet and crib. MOB reported infant will go to [facility] for follow-up care and denies any transportation barriers. MOB expressed interest in WIC resources, which CSW provided. MOB stated she has no additional questions or needs at this time.   CSW identifies no further need for intervention and no barriers to discharge at this time.  Kimberly Mendez, LCSWA Clinical Social Work [facility] [phone] 

## 2020-09-21 NOTE — Op Note (Signed)
Procedure(s): CESAREAN SECTION Procedure Note  Kimberly Mendez female 40 y.o. 09/21/2020  Procedure(s) and Anesthesia Type:    * CESAREAN SECTION - Regional  Surgeon(s) and Role:    * Mendez, Griselda Miner, MD - Primary  Indications: Kimberly Mendez 40 y.o. [phone] [redacted]w[redacted]d admitted for IOL due to advanced maternal age, on admission she was 0.5cm, received cytotec, foley balloon, pitocin. Patient SROM'ed, progressed to 5/80/0, due to recurrent variable deceleration pitocin was held and amnioinfusion started, initially recurrent variable decelerations resolved and pitocin was restarted. Patient then had a fever and fetal HR 180s, intraamniotic infection was diagnosed, ampicillin and gentamycin started. Fetal tachycardia improved, patient progressed to 7/100/0. Recurrent deep variables returned, pitocin stopped. I was then called to the room due to prolonged deceleration to 90s, SVE unchanged at 7/100/0, terbutaline given and FSE placed. Continued to have recurrent variable decelerations, decision made to proceed with urgent cesarean section due to nonreassuring fetal status. Patient consented and agreed to plan. For intraamniotic infection clindamycin and azithromycin given for perioperative antibiotics and plan to continued clindamycin, ampicillin, gentamycin for 1 additional dose postop.   Pre-operative Diagnosis: Advanced maternal age, pregnant at [redacted]w[redacted]d, intraamniotic infection, nonreassuring fetal status   Post-operative Diagnosis: Same      Surgeon: Rande Brunt   Anesthesia: Epidural anesthesia   Procedure Detail  CESAREAN SECTION  Findings: Normal mullerian anatomy.  Viable female infant "Kimberly Mendez" with weight 3405g (7pounds 8.1ounces) Apgars 3, 6 and 9. Verbal cord gas report: pH 7.12, pCO2 50.9  Estimated Blood Loss:  392cc         Specimens: Placenta to pathology         Complications:  None         Disposition: PACU - hemodynamically stable.         Condition:  stable    Description of Procedure: The patient was taken to the operating room where epidural anesthesia was found to be adequate.  The patient was placed in the dorsal supine position.  Fetal heart tones were confirmed.  The patient was subsequently prepped and draped in the normal sterile fashion.    A low transverse skin incision was made with a scalpel and carried down to the level of the fascia with the Bovie.  The fascia was incised in the midline with the scalpel and extended laterally with curved Mayo scissors.  Kocher clamps were applied to the superior fascial edge and the fascia was dissected off the rectus muscle sharply using the scalpel.  The Kocher clamps were transferred to the inferior fascial edge and the underlying rectus muscle was dissected off with curved Mayo's scissors.  The rectus muscles then were separated in the midline.  The peritoneum was found free of adherent bowel and the peritoneal cavity was entered bluntly.  The uterus was identified and the alexis retractor was placed intraperitoneal.  A bladder flap was then created sharply with Metzenbaum scissors and separated from the lower uterine segment digitally.   A low transverse hysterotomy was then made with a scalpel.  The infant was found in the cephalic presentation was delivered atraumatically and without difficulty in the usual fashion.  Due to poor tone the cord was clamped and cut and infant was handed off to the pediatricians.  The placenta was delivered with gentle traction on umbilical cord and manual massage of the uterine fundus.  The uterus was cleared of all clot and debris.  The hysterotomy was then closed with 0 Monocryl in a running locked fashion.  Followed by 0 Monocryl in an imbricating fashion.  The hysterotomy was found to be hemostatic.  The peritoneum was closed with 2-0 Vicryl in a running fashion.  The fascia was closed with a 0 loop PDS in a continuous running fashion. The subcutaneous tissue was  irrigated and rendered hemostatic with cautery.  The subcutaneous layer was subsequently closed with 2-0 Vicryl in a continuous running fashion in two layers.  The skin was closed with vicryl on a keith needle in a running subcuticular fashion.  Sponge, lap and needle counts were correct. Honeycomb dressing was placed on the incision.   Kimberly Mendez 09/21/20 1:33 AM

## 2020-09-21 NOTE — Progress Notes (Signed)
Subjective: Postpartum Day 0: Cesarean Delivery Patient reports pain controlled, no nausea or vomiting  Objective: Vital signs in last 24 hours: Temp:  [98.2 F (36.8 C)-103.3 F (39.6 C)] 98.2 F (36.8 C) (06/01 0840) Pulse Rate:  [83-153] 112 (06/01 0840) Resp:  [16-26] 17 (06/01 0840) BP: (93-142)/(40-97) 93/57 (06/01 0840) SpO2:  [98 %-100 %] 98 % (06/01 0840)  Physical Exam:  General: alert, cooperative and appears stated age Lochia: appropriate Uterine Fundus: firm Incision: healing well DVT Evaluation: No evidence of DVT seen on physical exam.  Recent Labs    09/20/20 0058 09/21/20 0601  HGB 9.5* 7.8*  HCT 33.7* 27.0*    Assessment/Plan: 40 year old G5 P2-0-3-2 postop day #0 status post primary cesarean section for nonreassuring fetal wellbeing 1) chorioamnionitis: Afebrile since 02 15.  Patient has 1 more dose of IV antibiotics ordered. 2) Status post Cesarean section. Doing well postoperatively.  3) Continue current care. 4) Acute blood loss anemia: po iron 5) Desires neonatal circumcision, R/B/A of procedure discussed at length. Pt understands that neonatal circumcision is not considered medically necessary and is elective. The risks include, but are not limited to bleeding, infection, damage to the penis, development of scar tissue, and having to have it redone at a later date. Pt understands theses risks and wishes to proceed. Will PP until tomorrow   Waynard Reeds 09/21/2020, 10:49 AM

## 2020-09-21 NOTE — Lactation Note (Signed)
This note was copied from a baby's chart. Lactation Consultation Note  Patient Name: Kimberly Mendez YBWLS'L Date: 09/21/2020   Age:42 hours  Initial visit at 6 hours of life. Mom is a P2, but did not nurse her 1st child (now 51 yo). Mom reports + breast changes w/pregnancy.   Hand expression was done briefly with Mom; no colostrum was noted, but Mom reported having seen colostrum 2 days ago when practicing with her pump at home. Mom has a hands-free pump at home.  Mom is interested in pumping & BO while here. She will let us know when she is ready to be set up with a pump. On breast exam, it appears that size 24 flanges will be an appropriate flange size for her. Mom knows to also call out if she is interested in assist with latching infant.   Breastfeeding brochure was provided; Mom was made aware of breastfeeding support groups, and our phone # for post-discharge questions.   In regards to bottle feeding, I told Mom to feed infant until he is content.  Lurline [facility] 09/21/2020, 7:39 AM

## 2020-09-22 ENCOUNTER — Inpatient Hospital Stay (HOSPITAL_COMMUNITY): Payer: Medicaid Other

## 2020-09-22 LAB — SURGICAL PATHOLOGY

## 2020-09-22 MED ORDER — IBUPROFEN 100 MG/5ML PO SUSP
600.0000 mg | Freq: Four times a day (QID) | ORAL | Status: DC
  Administered 2020-09-22 – 2020-09-23 (×3): 600 mg via ORAL
  Filled 2020-09-22 (×3): qty 30

## 2020-09-22 NOTE — Progress Notes (Addendum)
Subjective: Postpartum Day 1: Cesarean Delivery Patient reports ambulating well, voiding, tolerating PO. Reports feeling sore today but thinks she over did it yesterday. Interested in pumping.   Objective: Patient Vitals for the past 24 hrs:  BP Temp Temp src Pulse Resp SpO2  09/21/20 2316 106/63 (!) 97.5 F (36.4 C) Oral 89 18 100 %  09/21/20 2012 (!) 113/57 98.7 F (37.1 C) Oral (!) 103 17 96 %  09/21/20 1516 100/66 97.7 F (36.5 C) Oral 82 18 100 %  09/21/20 1300 -- -- -- -- -- 99 %  09/21/20 1116 -- 98.1 F (36.7 C) Oral -- 18 100 %    Physical Exam:  General: alert and no distress Lochia: appropriate Uterine Fundus: firm Incision: dressing dry, intact. Has some old blood on it. Recommend removal tomorrow DVT Evaluation: no evidence of DVT  Recent Labs    09/20/20 0058 09/21/20 0601  HGB 9.5* 7.8*  HCT 33.7* 27.0*    Assessment/Plan: Status post Cesarean section. Doing well postoperatively.  Continue current care. Suanne Marker [id number] POD#1 sp 1CS at [redacted]w[redacted]d 1. PPC: doing appropriately, plan for discharge tomorrow if continues to progress well 2. ABLA: Hgb 9.5>7.8, EBL 392cc, offered IV Venofer, declines. Plan PO iron 3. Intraamniotic infection: afebrile. Completed 1 additional dose of amp/gent/clinda postpartum 4. Circ: originally desires circumcision, undecided this AM. Discussed risks/benefits, patient wants to talk to Peds as well and then decide.  Rubella Immune, blood type B POS, bottle/pumping, baby boy, birth control considering depo at pp visit. Vaccines: tdap completed in pregnancy. Did not complete, flu, COVID vaccines.   Ichiro Chesnut K Taam-Akelman 09/22/2020, 8:57 AM

## 2020-09-22 NOTE — Lactation Note (Signed)
This note was copied from a baby's chart. Lactation Consultation Note  Patient Name: Kimberly Mendez [id number] Date: 09/22/2020 Reason for consult: Follow-up assessment;Term;1st time breastfeeding;Infant weight loss;Other (Comment) (1% weight loss. For now per mom wants to pump and Bottefeed. LC set up the DEBP  - see below.) Age:42 hours - see below  LC reviewed supply and demand / importance of consistent pumping around the clock both breast for 15 mins/ save milk for the next feeding.   Maternal Data    Feeding Mother's Current Feeding Choice: Breast Milk and Formula  LATCH Score                    Lactation Tools Discussed/Used Tools: Pump;Flanges Flange Size: 24 (LC checked size - appeared snug when the pump increased and per mom comfortable. mom aware she has #27 flanges if needed.) Breast pump type: Double-Electric Breast Pump Pump Education: Milk Storage;Setup, frequency, and cleaning  Interventions    Discharge    Consult Status Consult Status: Follow-up Date: 09/23/20 Follow-up type: In-patient    Matilde Sprang Marifer Hurd 09/22/2020, 4:02 PM

## 2020-09-23 ENCOUNTER — Ambulatory Visit: Payer: Medicaid Other

## 2020-09-23 MED ORDER — OXYCODONE HCL 5 MG PO TABS: 5.0000 mg | ORAL_TABLET | ORAL | 0 refills | Status: DC | PRN

## 2020-09-23 MED ORDER — IBUPROFEN 100 MG/5ML PO SUSP: 600.0000 mg | Freq: Four times a day (QID) | ORAL | 1 refills | Status: DC

## 2020-09-23 NOTE — Discharge Summary (Signed)
Postpartum Discharge Summary  Date of Service updated 09/23/20     Patient Name: Kimberly Mendez DOB: 11-Sep-1980 MRN: [id number]  Date of admission: 09/20/2020 Delivery date:09/21/2020  Delivering provider: Janey Greaser K  Date of discharge: 09/23/2020  Admitting diagnosis: AMA (advanced maternal age) multigravida 35+ [O09.529] Intrauterine pregnancy: [redacted]w[redacted]d     Secondary diagnosis:  Active Problems:   AMA (advanced maternal age) multigravida 35+  Additional problems: Iron deficiency anemia s/p IV iron in second trimester    Discharge diagnosis: Term Pregnancy Delivered                                              Post partum procedures:None Augmentation: AROM, Pitocin, Cytotec and IP Foley Complications: None  Hospital course: Induction of Labor With Cesarean Section   40 y.o. yo [id number] at [redacted]w[redacted]d was admitted to the hospital 09/20/2020 for induction of labor. Patient had a labor course significant for NRFS.  She received cytotoc, foley bulb, amniotomy.  She developed chorioamnionitis and had recurrent variable decelerations unresponsive to resuscitative measures.  The patient went for cesarean section due to Non-Reassuring FHR. Delivery details are as follows: Membrane Rupture Time/Date: 4:00 PM ,09/20/2020   Delivery Method:C-Section, Low Transverse  Details of operation can be found in separate operative Note.  Patient had an uncomplicated postpartum course. She is ambulating, tolerating a regular diet, passing flatus, and urinating well.  Patient is discharged home in stable condition on 09/23/20.      Newborn Data: Birth date:09/21/2020  Birth time:12:42 AM  Gender:Female  Living status:Living  Apgars:3 ,6  Weight:3405 g                                   Physical exam  Vitals:   09/21/20 2316 09/22/20 1423 09/22/20 2300 09/23/20 0609  BP: 106/63 97/61 (!) 108/57 112/63  Pulse: 89 78 62 70  Resp: 18 18 18 19   Temp: (!) 97.5 F (36.4 C) 98 F (36.7 C) 98.1 F (36.7  C) 98.2 F (36.8 C)  TempSrc: Oral Oral Oral Oral  SpO2: 100% 98% 100% 99%  Weight:      Height:       General: alert, cooperative and no distress Lochia: appropriate Uterine Fundus: firm Incision: Healing well with no significant drainage DVT Evaluation: Negative Homan's sign. Labs: Lab Results  Component Value Date   WBC 30.7 (H) 09/21/2020   HGB 7.8 (L) 09/21/2020   HCT 27.0 (L) 09/21/2020   MCV 73.6 (L) 09/21/2020   PLT 236 09/21/2020   No flowsheet data found. Edinburgh Score: Edinburgh Postnatal Depression Scale Screening Tool 09/22/2020  I have been able to laugh and see the funny side of things. 0  I have looked forward with enjoyment to things. 0  I have blamed myself unnecessarily when things went wrong. 1  I have been anxious or worried for no good reason. 2  I have felt scared or panicky for no good reason. 1  Things have been getting on top of me. 1  I have been so unhappy that I have had difficulty sleeping. 0  I have felt sad or miserable. 0  I have been so unhappy that I have been crying. 0  The thought of harming myself has occurred to me. 0  11/22/2020 Postnatal  Depression Scale Total 5      After visit meds:  Allergies as of 09/23/2020      Reactions   Asa [aspirin] Hives, Swelling      Medication List    STOP taking these medications   folic acid 400 MCG tablet Commonly known as: FOLVITE     TAKE these medications   ibuprofen 100 MG/5ML suspension Commonly known as: ADVIL Take 30 mLs (600 mg total) by mouth every 6 (six) hours.   oxyCODONE 5 MG immediate release tablet Commonly known as: Oxy IR/ROXICODONE Take 1-2 tablets (5-10 mg total) by mouth every 4 (four) hours as needed for moderate pain.   prenatal multivitamin Tabs tablet Take 1 tablet by mouth daily at 12 noon.        Discharge home in stable condition Infant Feeding: Bottle Infant Disposition:home with mother Discharge instruction: per After Visit Summary and Postpartum  booklet. Activity: Advance as tolerated. Pelvic rest for 6 weeks.  Diet: routine diet Postpartum Appointment:4 weeks  Future Appointments:No future appointments. Follow up Visit:  Follow-up Information    Taam-Akelman, Griselda Miner, MD Follow up in 4 week(s).   Specialty: Obstetrics and Gynecology Contact information: [address] [place] [address] [phone]                   09/23/2020 [facility] Lizabeth Leyden, MD

## 2020-09-26 ENCOUNTER — Inpatient Hospital Stay (HOSPITAL_COMMUNITY): Admit: 2020-09-26 | Payer: Self-pay

## 2020-10-07 ENCOUNTER — Other Ambulatory Visit (HOSPITAL_COMMUNITY): Payer: Self-pay | Admitting: Obstetrics & Gynecology

## 2020-10-07 MED ORDER — OXYCODONE-ACETAMINOPHEN 5-325 MG PO TABS: 1.0000 | ORAL_TABLET | Freq: Four times a day (QID) | ORAL | 0 refills | Status: DC | PRN

## 2020-10-07 NOTE — Progress Notes (Signed)
Patient called office, documented in [place]. Rx sent for percocet x 10 tablets for postop CS pain [name] 10/07/20 11:22 AM

## 2021-06-18 ENCOUNTER — Ambulatory Visit (HOSPITAL_COMMUNITY)
Admission: EM | Admit: 2021-06-18 | Discharge: 2021-06-18 | Disposition: A | Discharge status: Home / Self Care | Payer: Medicaid Other | Attending: Psychiatry | Admitting: Psychiatry

## 2021-06-18 DIAGNOSIS — Z79899 Other long term (current) drug therapy: Secondary | ICD-10-CM | POA: Insufficient documentation

## 2021-06-18 DIAGNOSIS — F4323 Adjustment disorder with mixed anxiety and depressed mood: Secondary | ICD-10-CM

## 2021-06-18 MED ORDER — BUSPIRONE HCL 5 MG PO TABS: 7.5000 mg | ORAL_TABLET | Freq: Two times a day (BID) | ORAL | 0 refills | Status: DC

## 2021-06-18 MED ORDER — FLUOXETINE HCL 20 MG PO CAPS: 20.0000 mg | ORAL_CAPSULE | Freq: Every day | ORAL | 0 refills | Status: DC

## 2021-06-18 MED ORDER — HYDROXYZINE HCL 25 MG PO TABS: 25.0000 mg | ORAL_TABLET | Freq: Three times a day (TID) | ORAL | 0 refills | Status: AC | PRN

## 2021-06-18 NOTE — ED Provider Notes (Signed)
Behavioral Health Urgent Care Medical Screening Exam  Patient Name: Kimberly Mendez MRN: [id number] Date of Evaluation: 06/18/21 Chief Complaint:   Diagnosis:  Final diagnoses:  Adjustment disorder with mixed anxiety and depressed mood    History of Present illness: Kimberly Mendez is a 41 y.o. female patient presented to [facility] as a walk in alone with complaints of increased depression and anxiety.    Kimberly Mendez, 41 y.o., female patient seen face to face by this provider, consulted with Dr. Viviano Simas; and chart reviewed on 06/18/21. Patient reports she has a history of depression and anxiety. She had outpatient services in place with [facility], but no longer sees them due to cost.  She has been prescribed Prozac 20 mg daily and Xanax 1 mg 3 times daily as needed.  Per PDMP patient has not been prescribed Xanax since 04/06/2021.  She has [employer] but it may be cancelled at the end of the month.  She lives at home with her partner (child's father) and 8 month child.  She has a 61 year old child that no longer lives in the home.  She is the primary caretaker for her 42 month old.  Her baby recently had surgery to correct a fistula 1-2 weeks ago.  States since that time her baby has not sleeping and has been crying a lot.  She has been unable to sleep at nighttime due to the baby getting up.  Reports her baby will need another surgery in roughly 1 week.  Patient has a mother who lives in [place] but due to her work schedule she is unable to be of assistance helping with the baby.  Her partner's parents live out of state. They are handicapped and are unable to assist.  She presents today requesting refills on her Prozac and Xanax and additional outpatient psychiatric resources.  During evaluation Kimberly Mendez is in sitting position in no acute distress.  She is fairly groomed and makes fair eye contact.  She is alert/oriented x4 and cooperative.  Her speech is clear, coherent,  normal rate and tone.  She is tearful at times during the assessment.  She endorses increased anxiety and depression with increased crying spells and decreased energy. States she does not want to take a sleep aid, due to her baby needing her through the night. States, "I know this is mostly from not sleeping and just feeling overwhelmed".  Reports things have gotten worse since her baby surgery 1-2 weeks ago.  States before that time she was able to sleep and she felt better.  She is only sleeping a few hours per night due to her baby schedule.  She denies any concerns with appetite.  Objectively she does not appear to be responding to internal/external stimuli.  She denies AVH/HI. She denies any thoughts of wanting to harm her baby.  She denies any suicidal ideations, plan, intent, or access to means.  She denies any past history of suicide attempts.  She denies any previous inpatient psychiatric admissions.  She declined for this writer to contact collateral.   Discussed PHP/IOP with patient.  States she may be interested in virtual PHP because she cannot leave her child. Her baby is having another surgery in a week and she does not want to commit to program at this time.  Discussed outpatient psychiatric services on second floor including open access walk-in hours for medication management and therapy.  States she maybe able to do a walk in appointment in the morning. Discussed  adding BuSpar 7.5 mg twice daily and hydroxyzine 25 mg 3 times daily as needed for anxiety.  Educated patient on side effects of medications including sleepiness with hydroxyzine.  Patient verbalized understanding.  She is adamant that she no longer breast-feeds and is not pregnant or planning to get pregnant at this time.  At this time Kimberly Mendez is educated and verbalizes understanding of mental health resources and other crisis services in the community.  She is instructed to call 911 and present to the nearest emergency room  should she experience any suicidal/homicidal ideation, auditory/visual/hallucinations, or detrimental worsening of her mental health condition.  She was a also advised by Clinical research associate that she could call the toll-free phone on insurance card to assist with identifying in network counselors and agencies or number on back of Medicaid card t speak with care coordinator    Psychiatric Specialty Exam  Presentation  General Appearance:Fairly Groomed; Casual  Eye Contact:Fair  Speech:Clear and Coherent; Normal Rate  Speech Volume:Normal  Handedness:Right   Mood and Affect  Mood:Anxious; Depressed  Affect:Tearful; Congruent   Thought Process  Thought Processes:Coherent  Descriptions of Associations:Intact  Orientation:Full (Time, Place and Person)  Thought Content:Logical    Hallucinations:None  Ideas of Reference:None  Suicidal Thoughts:No  Homicidal Thoughts:No   Sensorium  Memory:Immediate Good; Recent Good; Remote Good  Judgment:Good  Insight:Good   Executive Functions  Concentration:Good  Attention Span:Good  Recall:Good  Fund of Knowledge:Good  Language:Good   Psychomotor Activity  Psychomotor Activity:Normal   Assets  Assets:Communication Skills; Desire for Improvement; Financial Resources/Insurance; Housing; Physical Health; Vocational/Educational; Transportation   Sleep  Sleep:Poor  Number of hours: 4   No data recorded  Physical Exam: Physical Exam Vitals and nursing note reviewed.  Constitutional:      General: She is not in acute distress.    Appearance: Normal appearance. She is not ill-appearing.  HENT:     Head: Normocephalic.  Eyes:     General:        Right eye: No discharge.        Left eye: No discharge.     Conjunctiva/sclera: Conjunctivae normal.  Cardiovascular:     Rate and Rhythm: Normal rate.  Pulmonary:     Effort: Pulmonary effort is normal.  Musculoskeletal:        General: Normal range of motion.      Cervical back: Normal range of motion.  Skin:    Coloration: Skin is not jaundiced or pale.  Neurological:     Mental Status: She is alert and oriented to person, place, and time.  Psychiatric:        Attention and Perception: Attention and perception normal.        Mood and Affect: Mood is anxious and depressed. Affect is tearful.        Speech: Speech normal.        Behavior: Behavior is cooperative.        Thought Content: Thought content normal. Thought content does not include homicidal or suicidal ideation. Thought content does not include homicidal or suicidal plan.        Cognition and Memory: Cognition normal.        Judgment: Judgment normal.   Review of Systems  Constitutional: Negative.   HENT: Negative.    Eyes: Negative.   Respiratory: Negative.    Cardiovascular: Negative.   Musculoskeletal: Negative.   Skin: Negative.   Neurological: Negative.   Psychiatric/Behavioral:  Positive for depression. The patient is nervous/anxious.   Blood  pressure 129/82, pulse 69, temperature 99.3 F (37.4 C), temperature source Oral, resp. rate 18, SpO2 99 %, unknown if currently breastfeeding. There is no height or weight on file to calculate BMI.  Musculoskeletal: Strength & Muscle Tone: within normal limits Gait & Station: normal Patient leans: N/A   [facility] MSE Discharge Disposition for Follow up and Recommendations: Based on my evaluation the patient does not appear to have an emergency medical condition and can be discharged with resources and follow up care in outpatient services for Medication Management, Partial Hospitalization Program, and Individual Therapy  Discharge patient  Discussed outpatient psychiatric services on second floor including open access walk-in hours for medication management and therapy.  Provided the patient with a 1 month refill for BuSpar 7.5 mg twice daily (for anxiety), hydroxyzine 25 mg 3 times daily as needed for anxiety and Prozac 20 mg daily for  depression.  Educated patient on side effects of medications including sleepiness with hydroxyzine.  Patient verbalized understanding.  She is adamant that she no longer breast-feeds and is not pregnant or planning to get pregnant at this time. Discussed PHP/IOP with patient.  States she may be interested in virtual PHP because she cannot leave her child.  However her baby is having another surgery in a week and she does not want to commit to program at this time.  No evidence of imminent risk to self or others at present.    Patient does not meet criteria for psychiatric inpatient admission. Discussed crisis plan, support from social network, calling 911, coming to the Emergency Department, and calling Suicide Hotline.    Ardis Hughs, NP 06/18/2021, 8:36 AM

## 2021-06-18 NOTE — BH Assessment (Signed)
Kimberly Mendez, Routine; 41 year old presents this date unaccompanied.  Pt denies SI, HI, or AVH.  Pt reports, she is out of her medication, "I am feeling overwhelmed".  Pt admits to prior MH diagnosis or prescribed medication for symptom management.  MSE signed by patient.

## 2021-06-18 NOTE — Discharge Instructions (Addendum)
Patient is instructed prior to discharge to: Take all medications as prescribed by his/her mental healthcare provider. Report any adverse effects and or reactions from the medicines to his/her outpatient provider promptly. Patient has been instructed & cautioned: To not engage in alcohol and or illegal drug use while on prescription medicines. In the event of worsening symptoms, patient is instructed to call the crisis hotline, 911 and or go to the nearest ED for appropriate evaluation and treatment of symptoms. To follow-up with his/her primary care provider for your other medical issues, concerns and or health care needs.     The suicide prevention education provided includes the following: Suicide risk factors Suicide prevention and interventions National Suicide Hotline telephone number [facility] assessment telephone number [facility] Emergency Assistance 911 [facility] and/or Residential Mobile Crisis Unit telephone number   Request made of family/significant other to: Remove weapons (e.g., guns, rifles, knives), all items previously/currently identified as safety concern.   Remove drugs/medications (over the counter, prescriptions, illicit drugs), all items previously/currently identified as a safety concern.   Please contact one of the following facilities to start medication management and therapy services:   [facility] [address] ([address] [place])  [place], [place]  [address] Phone: [phone]  [facility]  [address] [place], [place] [address] Phone: [phone]  [facility]  [address]  [place], [place] [address]  [facility]  [address]  [place], [place] [address] Phone: [phone]

## 2022-06-04 ENCOUNTER — Ambulatory Visit (INDEPENDENT_AMBULATORY_CARE_PROVIDER_SITE_OTHER): Payer: Medicaid Other | Admitting: Family Medicine

## 2022-06-04 ENCOUNTER — Encounter: Payer: Self-pay | Admitting: Family Medicine

## 2022-06-04 VITALS — BP 135/89 | HR 71 | Temp 98.0°F | Ht 62.5 in | Wt 148.0 lb

## 2022-06-04 DIAGNOSIS — Z136 Encounter for screening for cardiovascular disorders: Secondary | ICD-10-CM

## 2022-06-04 DIAGNOSIS — R7302 Impaired glucose tolerance (oral): Secondary | ICD-10-CM

## 2022-06-04 DIAGNOSIS — F4323 Adjustment disorder with mixed anxiety and depressed mood: Secondary | ICD-10-CM

## 2022-06-04 DIAGNOSIS — Z23 Encounter for immunization: Secondary | ICD-10-CM

## 2022-06-04 DIAGNOSIS — Z111 Encounter for screening for respiratory tuberculosis: Secondary | ICD-10-CM | POA: Diagnosis not present

## 2022-06-04 DIAGNOSIS — Z13220 Encounter for screening for lipoid disorders: Secondary | ICD-10-CM

## 2022-06-04 DIAGNOSIS — Z7689 Persons encountering health services in other specified circumstances: Secondary | ICD-10-CM

## 2022-06-04 DIAGNOSIS — R5382 Chronic fatigue, unspecified: Secondary | ICD-10-CM | POA: Diagnosis not present

## 2022-06-04 DIAGNOSIS — Z1159 Encounter for screening for other viral diseases: Secondary | ICD-10-CM

## 2022-06-04 NOTE — Progress Notes (Signed)
Per orders of Catalina Antigua MD, injection of Tuberculin given by Tobe Sos in right forearm. Patient tolerated injection well. Patient will make appointment for 2 day to have read.

## 2022-06-04 NOTE — Progress Notes (Signed)
New Patient Office Visit  Subjective    Patient ID: Kimberly Mendez, female    DOB: August 11, 1980  Age: 42 y.o. 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 Pt is new to me. Pt reports she has a 1 year son so she is sure she's gotten the Tdap. She has a 73 y.o daughter.  Pt's last Tdap 07/06/2020 and she will need a TB skin test. Pt has hx of anxiety which worsened after her child a year ago. She was started on Prozac and buspar. She was unable to take due to not being able to take pills and the syrup form was too expensive. She now has Hydroxyzine 23m TID prn for anxiety. She doesn't have to use this everyday. She will chew this when needed.   She reports she has had weight gain. She reports her PHQ scores is from the last month. Most times she feels ok and she can manage.  FBakerOffice [facility]9 Total Score 19          06/04/2022    9:08 AM  GAD 7 : Generalized Anxiety Score  Nervous, Anxious, on Edge 2  Control/stop worrying 2  Worry too much - different things 2  Trouble relaxing 2  Restless 1  Easily annoyed or irritable 3  Afraid - awful might happen 1  Total GAD 7 Score 13  Anxiety Difficulty Somewhat difficult      Outpatient Encounter Medications as of 06/04/2022  Medication Sig   hydrOXYzine (ATARAX) 25 MG tablet Take 1 tablet (25 mg total) by mouth 3 (three) times daily as needed for anxiety.   [DISCONTINUED] FLUoxetine (PROZAC) 20 MG capsule Take 1 capsule (20 mg total) by mouth daily.   [DISCONTINUED] busPIRone (BUSPAR) 5 MG tablet Take 1.5 tablets (7.5 mg total) by mouth 2 (two) times daily. (Patient not taking: Reported on 06/04/2022)    [DISCONTINUED] Prenatal Vit-Fe Fumarate-FA (PRENATAL MULTIVITAMIN) TABS tablet Take 1 tablet by mouth daily at 12 noon.   No facility-administered encounter medications on file as of 06/04/2022.    Past Medical History:  Diagnosis Date   ADHD    Anemia    Anxiety    Heart murmur     Past Surgical History:  Procedure Laterality Date   CESAREAN SECTION N/A 09/21/2020   Procedure: CESAREAN SECTION;  Surgeon: TJonelle Sidle MD;  Location: [facility] LD ORS;  Service: Obstetrics;  Laterality: N/A;   LIPOSUCTION  05/25/2019   abd    Family History  Problem Relation Age of Onset   Depression Sister    Drug abuse Sister    Miscarriages / SKoreaSister    Cancer Maternal Grandmother    Varicose Veins Paternal Grandmother     Social History   Socioeconomic History   Marital status: Single    Spouse name: Not on file   Number of children: Not on file   Years of education: Not on file   Highest education level: Not on file  Occupational History   Not on file  Tobacco Use   Smoking status: Former   Smokeless tobacco: Former  VScientific laboratory technicianUse: Never  used  Substance and Sexual Activity   Alcohol use: Not Currently   Drug use: Not Currently    Types: Marijuana    Comment: Patient reports marijuana use in her 23s   Sexual activity: Not on file  Other Topics Concern   Not on file  Social History Narrative   Not on file   Social Determinants of Health   Financial Resource Strain: Not on file  Food Insecurity: Not on file  Transportation Needs: Not on file  Physical Activity: Not on file  Stress: Not on file  Social Connections: Not on file  Intimate Partner Violence: Not on file    Review of Systems  Constitutional:  Positive for malaise/fatigue.       Weight gain  Skin:        Hair shedding  Psychiatric/Behavioral:  Positive for depression. The patient is nervous/anxious.   All other systems reviewed and are negative.       Objective    BP  135/89   Pulse 71   Temp 98 F (36.7 C) (Oral)   Ht 5' 2.5" (1.588 m)   Wt 148 lb (67.1 kg)   LMP 04/29/2022 (Exact Date)   SpO2 100%   BMI 26.64 kg/m   Physical Exam Vitals and nursing note reviewed.  Constitutional:      Appearance: Normal appearance. She is normal weight.  HENT:     Head: Normocephalic and atraumatic.     Right Ear: Tympanic membrane, ear canal and external ear normal.     Left Ear: Tympanic membrane, ear canal and external ear normal.     Nose: Nose normal.     Mouth/Throat:     Mouth: Mucous membranes are moist.     Pharynx: Oropharynx is clear.  Eyes:     Conjunctiva/sclera: Conjunctivae normal.     Pupils: Pupils are equal, round, and reactive to light.  Cardiovascular:     Rate and Rhythm: Normal rate and regular rhythm.     Pulses: Normal pulses.     Heart sounds: Normal heart sounds.  Pulmonary:     Effort: Pulmonary effort is normal.     Breath sounds: Normal breath sounds.  Abdominal:     General: Bowel sounds are normal.  Skin:    General: Skin is warm.     Capillary Refill: Capillary refill takes less than 2 seconds.  Neurological:     General: No focal deficit present.     Mental Status: She is alert and oriented to person, place, and time. Mental status is at baseline.  Psychiatric:        Mood and Affect: Mood normal.        Behavior: Behavior normal.        Thought Content: Thought content normal.        Judgment: Judgment normal.        Assessment & Plan:   Problem List Items Addressed This Visit   None Visit Diagnoses     Encounter to establish care with new doctor    -  Primary   Visit for TB skin test       Relevant Orders   PPD   Impaired glucose tolerance       Relevant Orders   CBC with Differential/Platelet   Comprehensive metabolic panel   Hemoglobin A1c   Need for hepatitis C screening test       Relevant Orders   Hepatitis C antibody   Encounter for lipid screening for cardiovascular  disease        Relevant Orders   Lipid panel   Chronic fatigue       Relevant Orders   TSH   T4, free   VITAMIN D 25 Hydroxy (Vit-D Deficiency, Fractures)   Need for Tdap vaccination       Relevant Orders   Tdap vaccine greater than or equal to 7yo IM   Adjustment reaction with anxiety and depression       Relevant Orders   Ambulatory referral to [facility]     Tb skin test placed today Screening labs to include thyroid check due to fatigue and mood changes. Pt to return in 2-3 days to have TB skin test read. Referral to counseling for adjustment disorder with depressed mood and anxiety.  No follow-ups on file.   Leeanne Rio, MD

## 2022-06-05 LAB — CBC WITH DIFFERENTIAL/PLATELET
Basophils Absolute: 0.1 10*3/uL (ref 0.0–0.2)
Basos: 1 %
EOS (ABSOLUTE): 0.5 10*3/uL — ABNORMAL HIGH (ref 0.0–0.4)
Eos: 7 %
Hematocrit: 37.8 % (ref 34.0–46.6)
Hemoglobin: 11.8 g/dL (ref 11.1–15.9)
Immature Grans (Abs): 0 10*3/uL (ref 0.0–0.1)
Immature Granulocytes: 0 %
Lymphocytes Absolute: 1.9 10*3/uL (ref 0.7–3.1)
Lymphs: 27 %
MCH: 21.8 pg — ABNORMAL LOW (ref 26.6–33.0)
MCHC: 31.2 g/dL — ABNORMAL LOW (ref 31.5–35.7)
MCV: 70 fL — ABNORMAL LOW (ref 79–97)
Monocytes Absolute: 0.5 10*3/uL (ref 0.1–0.9)
Monocytes: 7 %
Neutrophils Absolute: 4 10*3/uL (ref 1.4–7.0)
Neutrophils: 58 %
Platelets: 346 10*3/uL (ref 150–450)
RBC: 5.41 x10E6/uL — ABNORMAL HIGH (ref 3.77–5.28)
RDW: 15.8 % — ABNORMAL HIGH (ref 11.7–15.4)
WBC: 6.9 10*3/uL (ref 3.4–10.8)

## 2022-06-05 LAB — LIPID PANEL
Chol/HDL Ratio: 4.4 ratio (ref 0.0–4.4)
Cholesterol, Total: 220 mg/dL — ABNORMAL HIGH (ref 100–199)
HDL: 50 mg/dL (ref >39)
LDL Chol Calc (NIH): 141 mg/dL — ABNORMAL HIGH (ref 0–99)
Triglycerides: 163 mg/dL — ABNORMAL HIGH (ref 0–149)
VLDL Cholesterol Cal: 29 mg/dL (ref 5–40)

## 2022-06-05 LAB — T4, FREE: Free T4: 1.08 ng/dL (ref 0.82–1.77)

## 2022-06-05 LAB — COMPREHENSIVE METABOLIC PANEL
ALT: 15 IU/L (ref 0–32)
AST: 34 IU/L (ref 0–40)
Albumin/Globulin Ratio: 1.6 (ref 1.2–2.2)
Albumin: 4.5 g/dL (ref 3.9–4.9)
Alkaline Phosphatase: 92 IU/L (ref 44–121)
BUN/Creatinine Ratio: 12 (ref 9–23)
BUN: 10 mg/dL (ref 6–24)
Bilirubin Total: 0.3 mg/dL (ref 0.0–1.2)
CO2: 22 mmol/L (ref 20–29)
Calcium: 9.1 mg/dL (ref 8.7–10.2)
Chloride: 100 mmol/L (ref 96–106)
Creatinine, Ser: 0.85 mg/dL (ref 0.57–1.00)
Globulin, Total: 2.8 g/dL (ref 1.5–4.5)
Glucose: 91 mg/dL (ref 70–99)
Potassium: 4.8 mmol/L (ref 3.5–5.2)
Sodium: 137 mmol/L (ref 134–144)
Total Protein: 7.3 g/dL (ref 6.0–8.5)
eGFR: 87 mL/min/{1.73_m2} (ref >59)

## 2022-06-05 LAB — HEPATITIS C ANTIBODY: Hep C Virus Ab: NONREACTIVE

## 2022-06-05 LAB — VITAMIN D 25 HYDROXY (VIT D DEFICIENCY, FRACTURES): Vit D, 25-Hydroxy: 32.3 ng/mL (ref 30.0–100.0)

## 2022-06-05 LAB — TSH: TSH: 3.28 u[IU]/mL (ref 0.450–4.500)

## 2022-06-05 LAB — HEMOGLOBIN A1C
Est. average glucose Bld gHb Est-mCnc: 105 mg/dL
Hgb A1c MFr Bld: 5.3 % (ref 4.8–5.6)

## 2022-06-06 ENCOUNTER — Ambulatory Visit: Payer: Medicaid Other

## 2022-06-06 ENCOUNTER — Ambulatory Visit: Payer: Medicaid Other | Admitting: Family Medicine

## 2022-06-06 DIAGNOSIS — Z111 Encounter for screening for respiratory tuberculosis: Secondary | ICD-10-CM

## 2022-06-06 LAB — TB SKIN TEST
Induration: 0 mm
TB Skin Test: NEGATIVE

## 2022-06-06 NOTE — Progress Notes (Unsigned)
Pt was in the office today to have PPD read. TB result was negative with 0 mm duration.

## 2022-09-04 IMAGING — US US MFM OB FOLLOW-UP
1 series · 14 of 28 positions shown · non-contrast
Comparison: none

[Series 1: us mfm ob follow-up · 63 acquisitions, 14 frames shown]
[im 3/63]
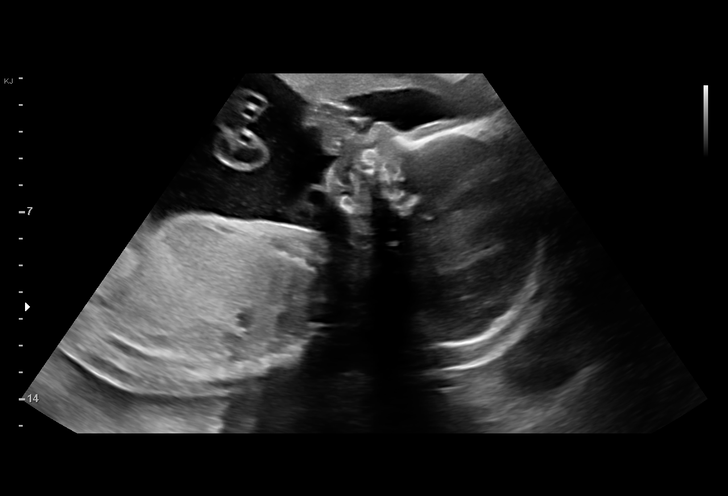
[im 7/63]
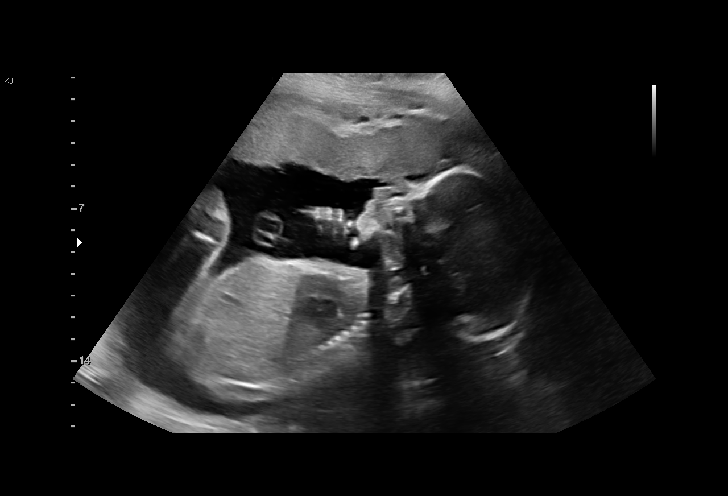
[im 12/63]
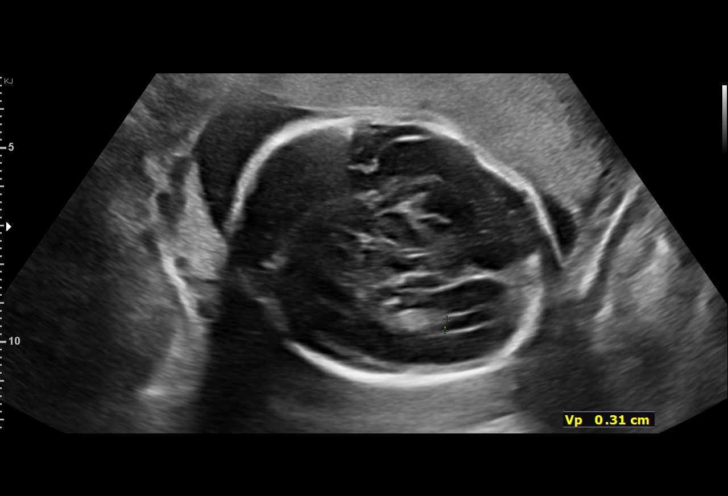
[im 17/63]
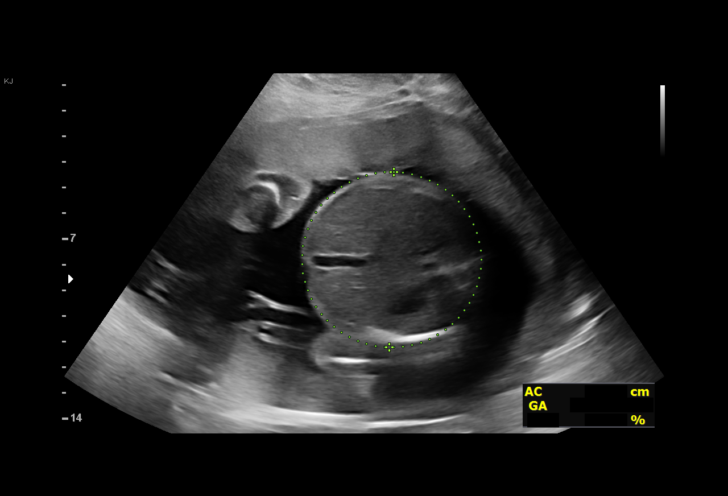
[im 21/63]
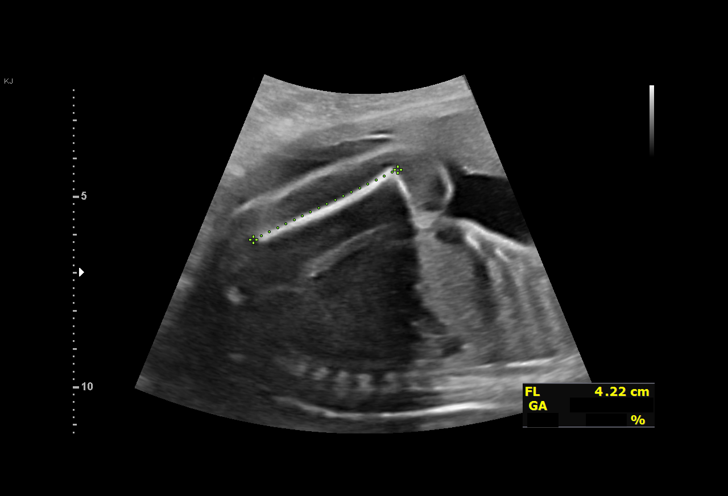
[im 26/63]
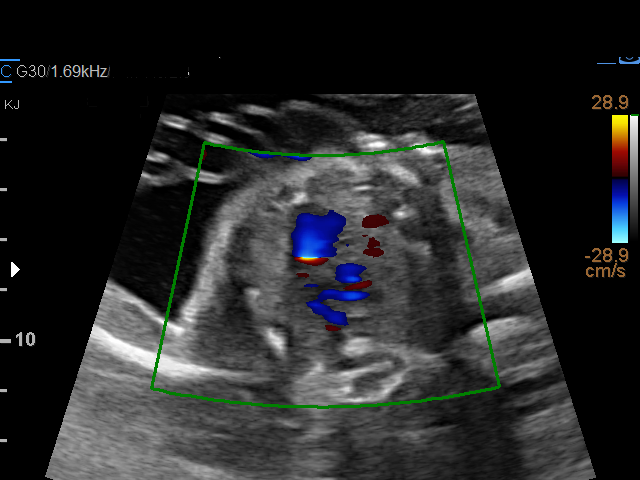
[im 30/63]
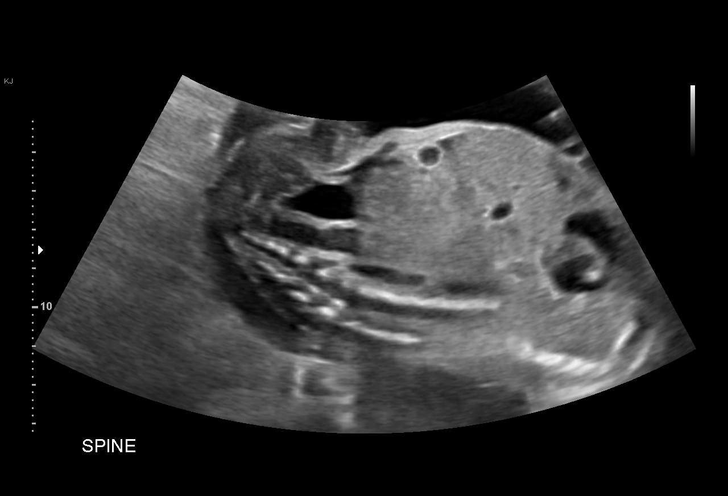
[im 35/63]
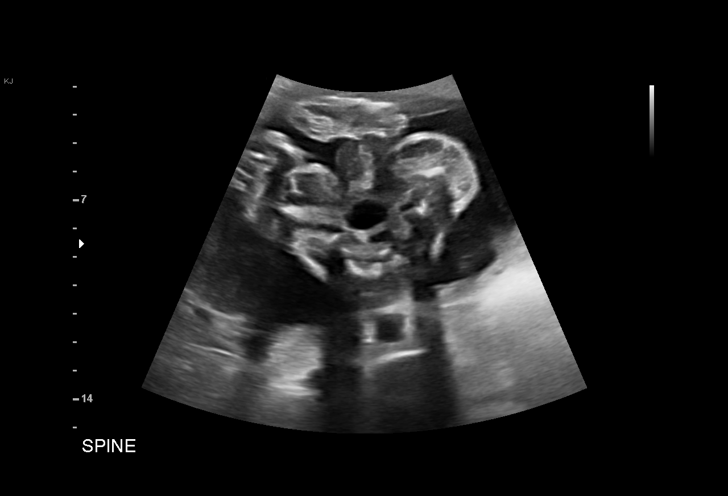
[im 40/63]
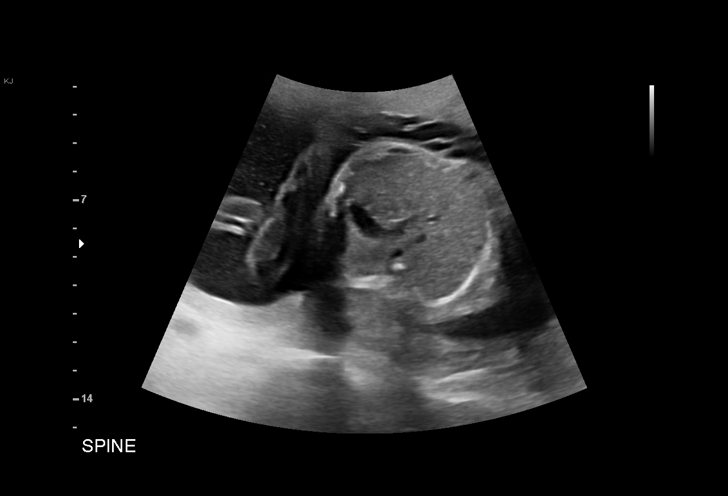
[im 44/63]
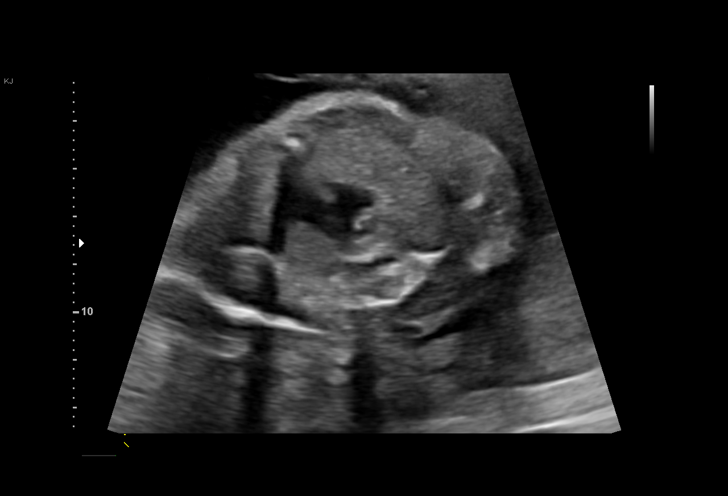
[im 49/63]
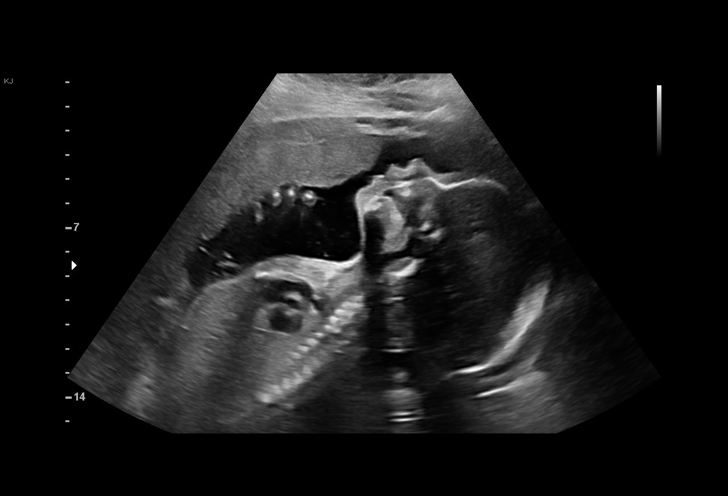
[im 53/63]
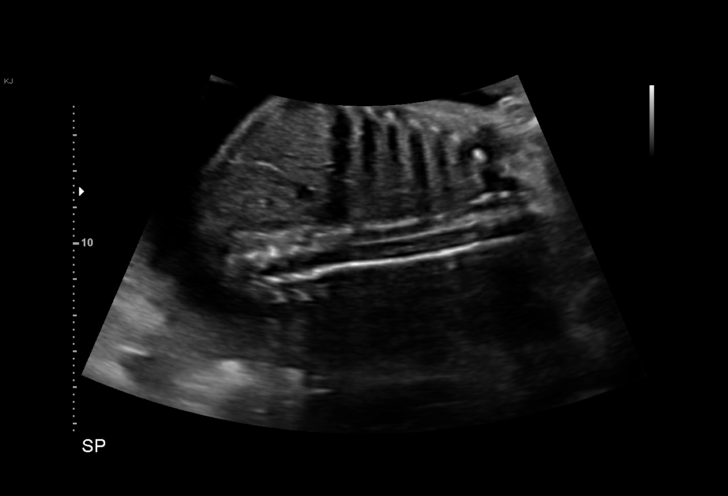
[im 58/63]
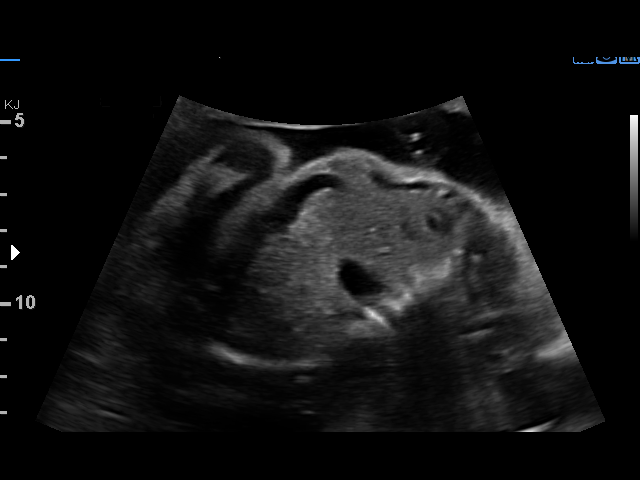
[im 63/63]
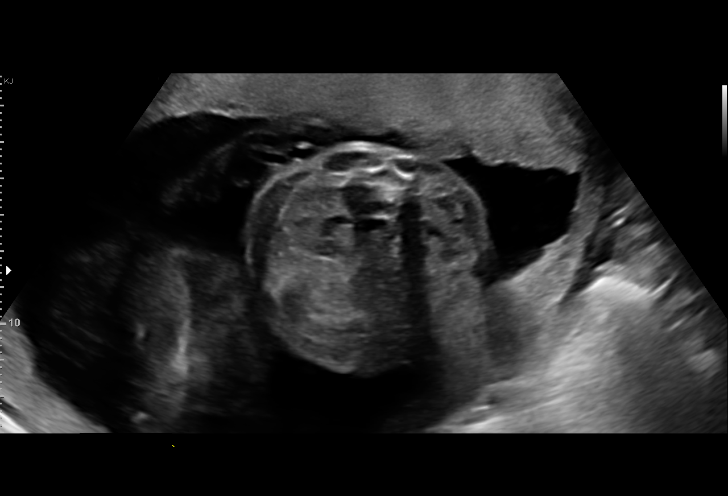

[14 of 28 positions shown; findings below may reference images not displayed]

OBGYN
                                                            [REDACTED]
                   JUMPER

Indications

 Advanced maternal age multigravida 35+,
 second trimester (39 yo)
 Encounter for antenatal screening for
 malformations ([id number])
 Anemia during pregnancy in second trimester
 24 weeks gestation of pregnancy
 Medical complication of pregnancy (heart
 murmur)
Fetal Evaluation

 Num Of Fetuses:         1
 Fetal Heart Rate(bpm):  157
 Cardiac Activity:       Observed
 Presentation:           Cephalic
 Placenta:               Anterior
 P. Cord Insertion:      Previously Visualized

 Amniotic Fluid
 AFI FV:      Within normal limits

                             Largest Pocket(cm)

Biometry
 BPD:      65.3  mm     G. Age:  26w 3d         93  %    CI:        72.67   %    70 - 86
                                                         FL/HC:      17.3   %    18.7 -
 HC:      243.6  mm     G. Age:  26w 3d         90  %    HC/AC:      1.13        1.04 -
 AC:      215.6  mm     G. Age:  26w 0d         84  %    FL/BPD:     64.6   %    71 - 87
 FL:       42.2  mm     G. Age:  23w 5d         15  %    FL/AC:      19.6   %    20 - 24

 LV:        3.1  mm

 Est. FW:     792  gm    1 lb 12 oz      73  %
OB History

 Blood Type:   B+
 Gravidity:    5
 Living:       1
Gestational Age

 LMP:           24w 4d        Date:  12/21/19                 EDD:   09/26/20
 U/S Today:     25w 5d                                        EDD:   09/18/20
 Best:          24w 4d     Det. By:  LMP  (12/21/19)          EDD:   09/26/20
Anatomy

 Ventricles:            Appears normal         Stomach:                Appears normal, left
                                                                       sided
 Palate:                Appears normal         Kidneys:                Appear normal
 Heart:                 Appears normal         Bladder:                Appears normal
                        (4CH, axis, and
                        situs)
 Diaphragm:             Appears normal         Spine:                  Limited views
                                                                       appear normal

 Other:  SVC IVC appears normal. 3VV/T appears normal. All other anatomy
         previously seen as normal.
Cervix Uterus Adnexa

 Cervix
 Length:           3.26  cm.
 Normal appearance by transabdominal scan.
Impression

 Patient returned for completion of fetal anatomy .
 Amniotic fluid is normal and good fetal activity is seen .Fetal
 biometry is consistent with her previously-established dates
 .Fetal anatomical survey was completed and appears normal.
Recommendations

 -An appointment was made for her to return in 8 weeks for
 fetal growth assessment.
 -Weekly BPP from 36 weeks' gestation (AMA).
                 Vaught, Dev

## 2022-12-03 ENCOUNTER — Ambulatory Visit: Payer: Medicaid Other | Admitting: Family Medicine

## 2022-12-04 IMAGING — US US MFM FETAL BPP W/O NON-STRESS
1 series · 14 of 28 positions shown · non-contrast
Comparison: none

[Series 1: us mfm fetal bpp w/o non-stress · 41 acquisitions, 14 frames shown]
[im 2/41]
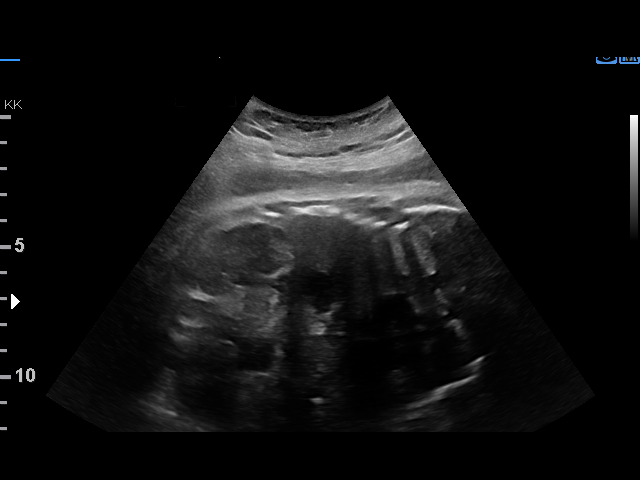
[im 5/41]
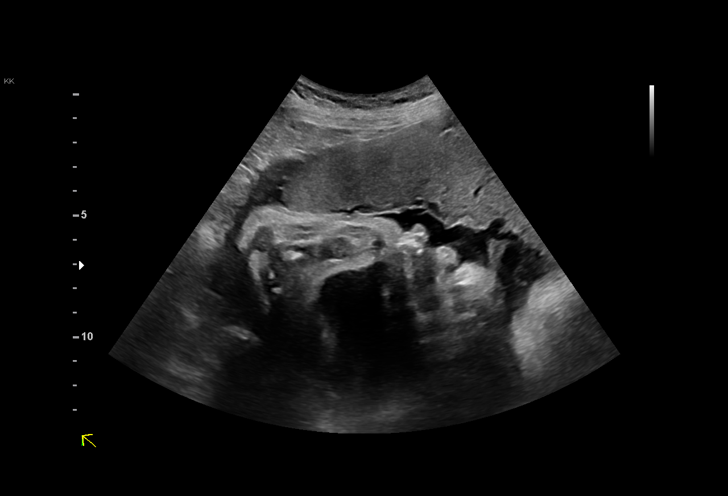
[im 8/41]
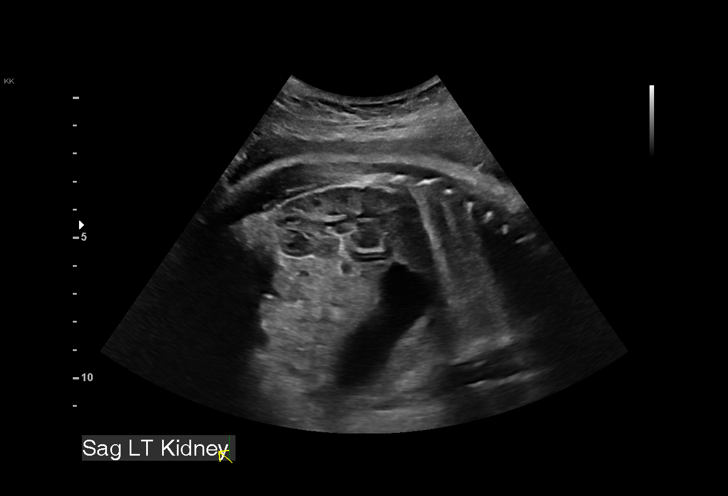
[im 11/41]
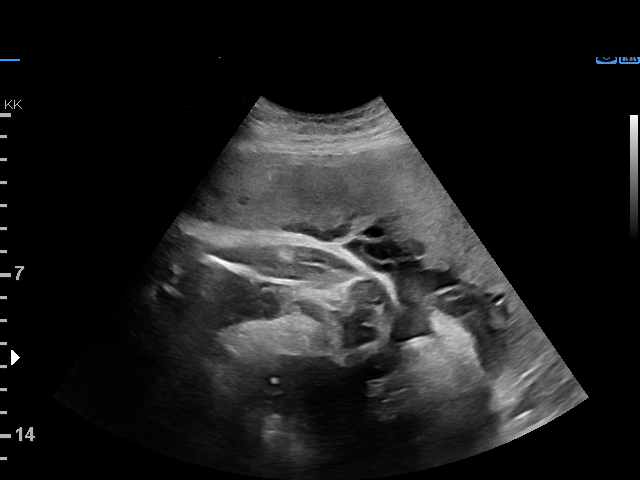
[im 14/41]
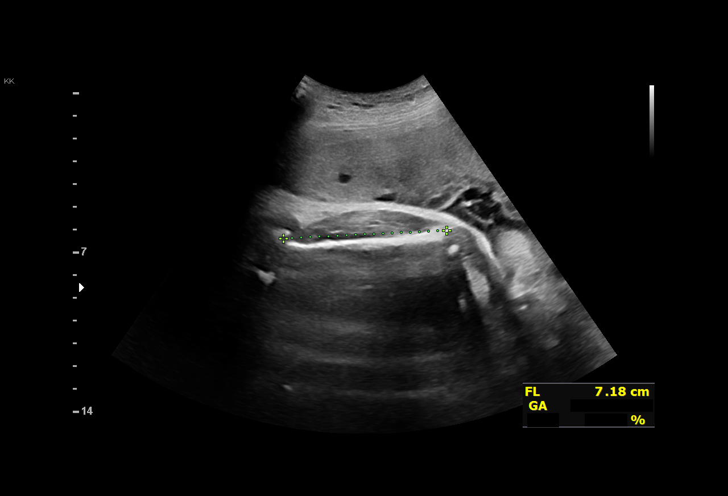
[im 17/41]
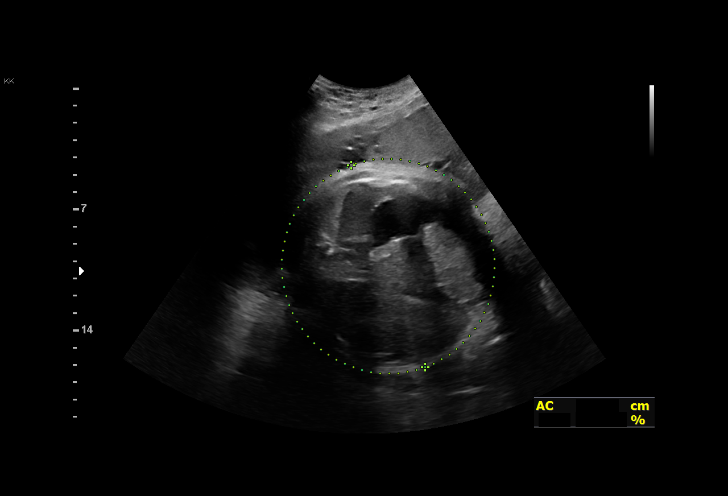
[im 20/41]
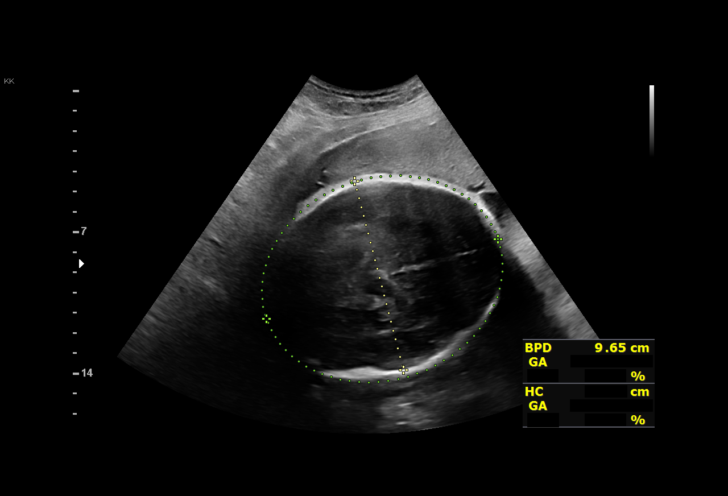
[im 23/41]
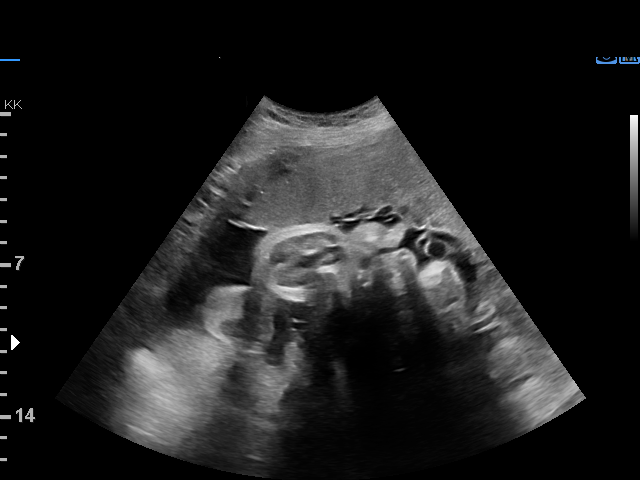
[im 26/41]
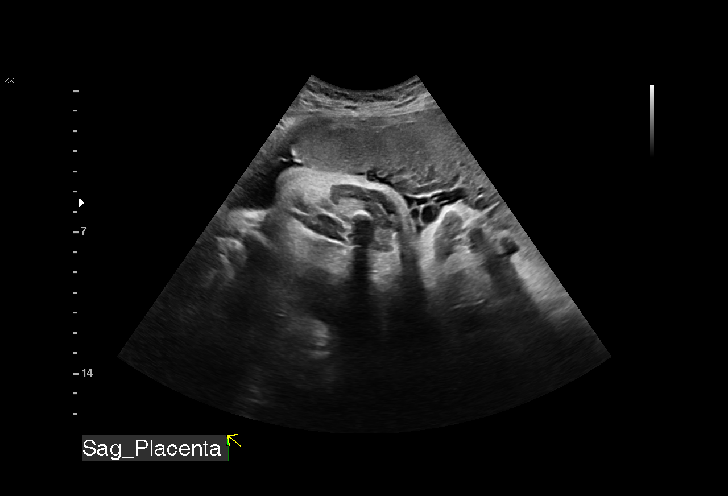
[im 29/41]
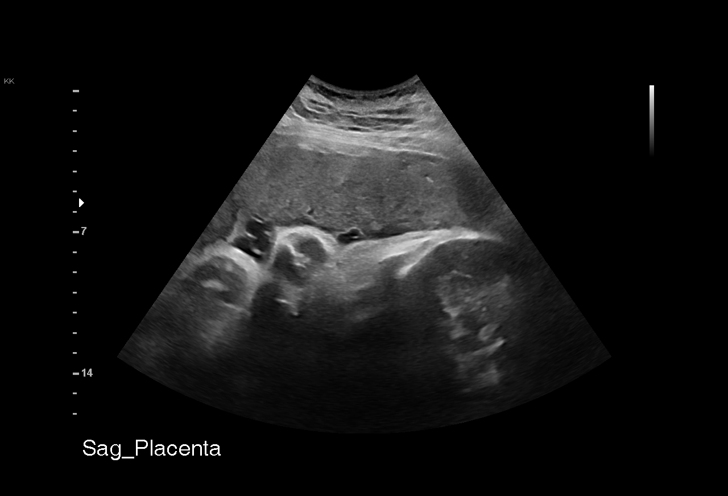
[im 32/41]
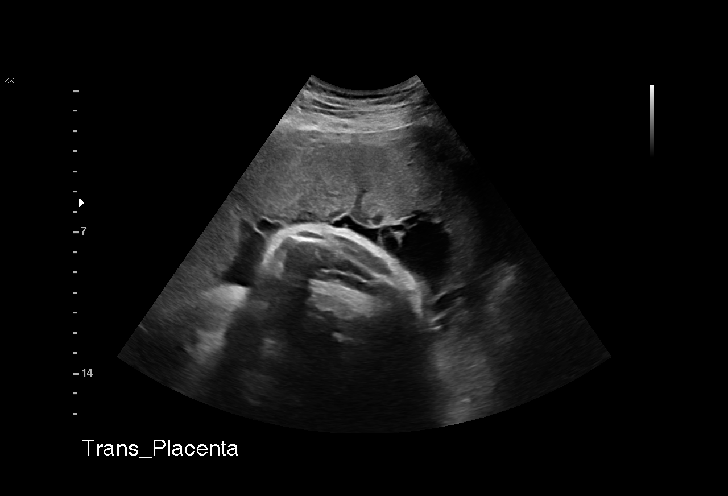
[im 35/41]
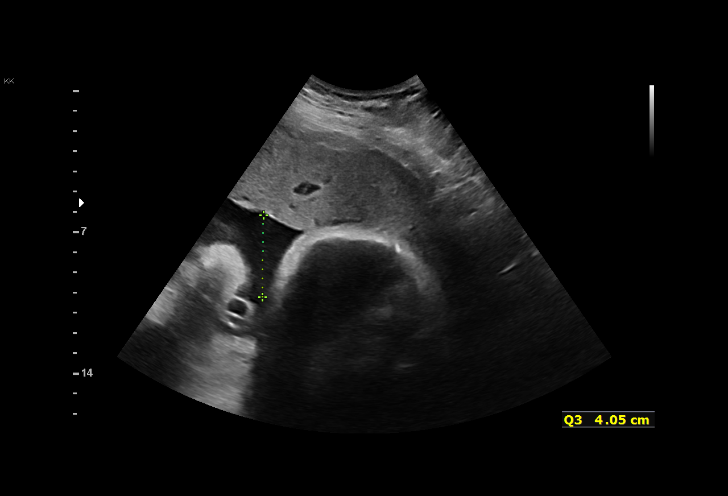
[im 38/41]
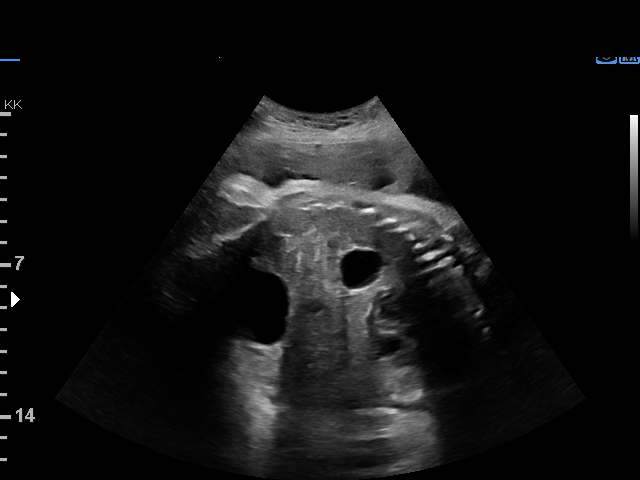
[im 41/41]
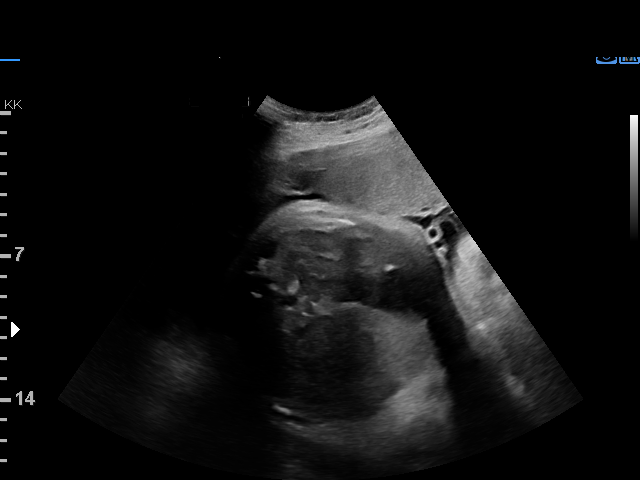

[14 of 28 positions shown; findings below may reference images not displayed]

OBGYN
                                                            [REDACTED]
                   JEVANTE

                                                      JUMPER

Indications

 37 weeks gestation of pregnancy
 Advanced maternal age multigravida 35+,
 third trimester(39 yrs)
 Anemia during pregnancy in third trimester
 Medical complication of pregnancy (heart
 murmur)
 Encounter for other antenatal screening
 follow-up
Fetal Evaluation

 Num Of Fetuses:         1
 Fetal Heart Rate(bpm):  157
 Cardiac Activity:       Observed
 Presentation:           Cephalic
 Placenta:               Anterior
 P. Cord Insertion:      Previously Visualized

 Amniotic Fluid
 AFI FV:      Within normal limits

 AFI Sum(cm)     %Tile       Largest Pocket(cm)
 17.4            67
 RUQ(cm)       RLQ(cm)       LUQ(cm)        LLQ(cm)

Biophysical Evaluation

 Amniotic F.V:   Pocket => 2 cm             F. Tone:        Observed
 F. Movement:    Observed                   Score:          [DATE]
 F. Breathing:   Observed
Biometry

 BPD:      96.7  mm     G. Age:  39w 4d         97  %    CI:        77.35   %    70 - 86
                                                         FL/HC:      20.3   %    20.9 -
 HC:      348.1  mm     G. Age:  40w 3d         88  %    HC/AC:      0.92        0.92 -
 AC:      377.3  mm     G. Age:  41w 5d       > 99  %    FL/BPD:     72.9   %    71 - 87
 FL:       70.5  mm     G. Age:  36w 1d         17  %    FL/AC:      18.7   %    20 - 24

 Est. FW:    9939  gm    8 lb 14 oz      99  %
OB History

 Blood Type:   B+
 Gravidity:    5
 Living:       1
Gestational Age

 LMP:           37w 4d        Date:  12/21/19                 EDD:   09/26/20
 U/S Today:     39w 3d                                        EDD:   09/13/20
 Best:          37w 4d     Det. By:  LMP  (12/21/19)          EDD:   09/26/20
Anatomy

 Cranium:               Appears normal         LVOT:                   Previously seen
 Cavum:                 Previously seen        Aortic Arch:            Previously seen
 Ventricles:            Appears normal         Ductal Arch:            Previously seen
 Choroid Plexus:        Previously seen        Diaphragm:              Appears normal
 Cerebellum:            Previously seen        Stomach:                Appears normal, left
                                                                       sided
 Posterior Fossa:       Previously seen        Abdomen:                Appears normal
 Nuchal Fold:           Previously seen        Abdominal Wall:         Previously seen
 Face:                  Orbits and profile     Cord Vessels:           Previously seen
                        previously seen
 Lips:                  Previously seen        Kidneys:                Appear normal
 Palate:                Previously seen        Bladder:                Appears normal
 Thoracic:              Appears normal         Spine:                  Limited views
                                                                       previously seen
 Heart:                 Previously seen        Upper Extremities:      Previously seen
 RVOT:                  Previously seen        Lower Extremities:      Previously seen

 Other:  Heels previously visualized. Male gender previously seen. VC, 3VV
         and 3VTV previously visualized. Technically difficult due to advanced
         GA and fetal position.
Cervix Uterus Adnexa
 Cervix
 Not visualized (advanced GA >41wks)
Impression

 Follow up growth given advanced maternal age and large for
 dates.
 Normal interval growth with measurements consistent with
 large for dates.
 Good fetal movement and amniotic fluid volume

 Given the large for gestational age I recommend delivery at
 39 weeks. In addition, I explained that counseling for
 cesarean delivery if significant divergence from the labor
 curve occurs and I would avoid an operative delivery.

 All questions answered.
Recommendations

 Continue weekly testing
 Consider delivery at 39 weeks.

## 2024-01-15 ENCOUNTER — Telehealth: Admitting: Physician Assistant

## 2024-01-15 DIAGNOSIS — J028 Acute pharyngitis due to other specified organisms: Secondary | ICD-10-CM | POA: Diagnosis not present

## 2024-01-15 DIAGNOSIS — B9689 Other specified bacterial agents as the cause of diseases classified elsewhere: Secondary | ICD-10-CM

## 2024-01-15 MED ORDER — AMOXICILLIN 500 MG PO CAPS: 500.0000 mg | ORAL_CAPSULE | Freq: Two times a day (BID) | ORAL | 0 refills | Status: DC

## 2024-01-15 MED ORDER — AMOXICILLIN 400 MG/5ML PO SUSR: 400.0000 mg | Freq: Two times a day (BID) | ORAL | 0 refills | Status: AC

## 2024-01-15 NOTE — Progress Notes (Signed)
 E-Visit for Sore Throat  We are sorry that you are not feeling well.  Here is how we plan to help!  Based on what you have shared with me it is likely that you have strep pharyngitis.  Strep pharyngitis is inflammation and infection in the back of the throat.  This is an infection cause by bacteria and is treated with antibiotics.  I have prescribed Amoxicillin  500 mg twice a day for 10 days. For throat pain, we recommend over the counter oral pain relief medications such as acetaminophen  or aspirin, or anti-inflammatory medications such as ibuprofen  or naproxen sodium. Topical treatments such as oral throat lozenges or sprays may be used as needed. Strep infections are not as easily transmitted as other respiratory infections, however we still recommend that you avoid close contact with loved ones, especially the very young and elderly.  Remember to wash your hands thoroughly throughout the day as this is the number one way to prevent the spread of infection and wipe down door knobs and counters with disinfectant.   Home Care: Only take medications as instructed by your medical team. Complete the entire course of an antibiotic. Do not take these medications with alcohol. A steam or ultrasonic humidifier can help congestion.  You can place a towel over your head and breathe in the steam from hot water  coming from a faucet. Avoid close contacts especially the very young and the elderly. Cover your mouth when you cough or sneeze. Always remember to wash your hands.  Get Help Right Away If: You develop worsening fever or sinus pain. You develop a severe head ache or visual changes. Your symptoms persist after you have completed your treatment plan.  Make sure you Understand these instructions. Will watch your condition. Will get help right away if you are not doing well or get worse.   Thank you for choosing an e-visit.  Your e-visit answers were reviewed by a board certified advanced  clinical practitioner to complete your personal care plan. Depending upon the condition, your plan could have included both over the counter or prescription medications.  Please review your pharmacy choice. Make sure the pharmacy is open so you can pick up prescription now. If there is a problem, you may contact your provider through [employer] and have the prescription routed to another pharmacy.  Your safety is important to us . If you have drug allergies check your prescription carefully.   For the next 24 hours you can use MyChart to ask questions about today's visit, request a non-urgent call back, or ask for a work or school excuse. You will get an email in the next two days asking about your experience. I hope that your e-visit has been valuable and will speed your recovery.    I have spent 5 minutes in review of e-visit questionnaire, review and updating patient chart, medical decision making and response to patient.   Delon CHRISTELLA Dickinson, PA-C

## 2024-01-15 NOTE — Addendum Note (Signed)
 Addended by: VIVIENNE DELON HERO on: 01/15/2024 11:43 AM   Modules accepted: Orders

## 2024-04-09 ENCOUNTER — Telehealth: Admitting: Family Medicine

## 2024-04-09 DIAGNOSIS — M5489 Other dorsalgia: Secondary | ICD-10-CM

## 2024-04-09 MED ORDER — BACLOFEN 10 MG PO TABS: 10.0000 mg | ORAL_TABLET | Freq: Three times a day (TID) | ORAL | 0 refills | Status: AC

## 2024-04-09 NOTE — Progress Notes (Signed)
 We are sorry that you are not feeling well.  Here is how we plan to help!  Based on what you have shared with me it, appears you may be experiencing acute back pain.   Acute back pain is defined as musculoskeletal pain that can resolve in 1-3 weeks with conservative treatment.  I have prescribed a non-steroid anti-inflammatory (NSAID) Naprosyn 500 mg -- take one by mouth twice a day, as well as a muscle relaxant, Baclofen  10 mg every eight hours as needed. Some patients experience stomach irritation or in increased heartburn with anti-inflammatory drugs.  Please keep in mind that muscle relaxer's can cause fatigue and should not be taken while at work or driving.  Back pain is very common.  The pain often gets better over time.  The cause of back pain is usually not dangerous.  Most people can learn to manage their back pain on their own.  Home Care Stay active.  Start with short walks on flat ground if you can.  Try to walk farther each day. Do not sit, drive or stand in one place for more than 30 minutes.  Do not stay in bed. Do not fully avoid exercise or work.  Activity can help your back heal faster. Be careful when you bend or lift an object.  Bend at your knees, keep the object close to you, and do not twist. Sleep on a firm mattress.  Lie on your side, and bend your knees.  If you lie on your back, put a pillow under your knees. Only take medicines as told by your doctor. Put ice on the injured area. Put ice in a plastic bag Place a towel between your skin and the bag Leave the ice on for 15-20 minutes, 3-4 times a day for the first 2-3 days.  After that, you can switch between ice and heat packs. Ask your doctor about back exercises or massage.   Get Help Right Way If: Your pain does not go away with rest and treatments given today. Your pain does not go away within 1 week. You have new problems. You do not feel well. The pain spreads into your legs. You cannot control when you  poop (bowel movement) or pee (urinate). You feel sick to your stomach (nauseous) or throw up (vomit). You have belly (abdominal) pain. You feel like you may pass out (faint). If you develop a fever.  Make Sure you: Understand these instructions. Continue to monitor your condition for any changes. Will get help right away if you are not doing well or get worse.  Your e-visit answers were reviewed by a board certified advanced clinical practitioner to complete your personal care plan.  Depending on the condition, your plan could have included both over the counter or prescription medications.  If there is a problem, please reply once you have received a response from your provider.  Your safety is important to us .  If you have drug allergies, check your prescription carefully.    You can use MyChart to ask questions about todays visit, request a non-urgent call back, or ask for a work or school excuse for 24 hours related to this e-Visit. If it has been greater than 24 hours you will need to follow up with your provider or enter a new e-Visit to address those concerns.  You will get an e-mail in the next two days asking about your experience.  I hope that your e-visit has been valuable and will speed your  recovery. Thank you for using e-visits.   I have spent 5 minutes in review of e-visit questionnaire, review and updating patient chart, medical decision making and response to patient.   Belem Hintze, FNP
